# Patient Record
Sex: Female | Born: 1960 | Race: White | Hispanic: No | Marital: Married | Smoking: Current every day smoker
Health system: Southern US, Community
[De-identification: ages and names within clinical notes are randomized; demographics above are authoritative.]

## PROBLEM LIST (undated history)

## (undated) ENCOUNTER — Ambulatory Visit

## (undated) ENCOUNTER — Inpatient Hospital Stay

## (undated) ENCOUNTER — Encounter

## (undated) ENCOUNTER — Encounter
Attending: Student in an Organized Health Care Education/Training Program | Primary: Student in an Organized Health Care Education/Training Program

## (undated) ENCOUNTER — Ambulatory Visit: Payer: Medicare (Managed Care)

## (undated) ENCOUNTER — Ambulatory Visit
Payer: BLUE CROSS/BLUE SHIELD | Attending: Student in an Organized Health Care Education/Training Program | Primary: Student in an Organized Health Care Education/Training Program

## (undated) ENCOUNTER — Telehealth

## (undated) ENCOUNTER — Encounter
Attending: Preventive Medicine/Occupational Environmental Medicine | Primary: Preventive Medicine/Occupational Environmental Medicine

## (undated) ENCOUNTER — Ambulatory Visit: Payer: MEDICARE

## (undated) ENCOUNTER — Ambulatory Visit: Payer: Medicare (Managed Care) | Attending: Medical Oncology | Primary: Medical Oncology

## (undated) ENCOUNTER — Other Ambulatory Visit

## (undated) ENCOUNTER — Ambulatory Visit: Payer: BLUE CROSS/BLUE SHIELD

## (undated) ENCOUNTER — Telehealth: Attending: Medical Oncology | Primary: Medical Oncology

## (undated) DIAGNOSIS — I10 Essential (primary) hypertension: Secondary | ICD-10-CM

## (undated) DIAGNOSIS — E785 Hyperlipidemia, unspecified: Secondary | ICD-10-CM

## (undated) DIAGNOSIS — E1142 Type 2 diabetes mellitus with diabetic polyneuropathy: Secondary | ICD-10-CM

## (undated) DIAGNOSIS — E119 Type 2 diabetes mellitus without complications: Secondary | ICD-10-CM

## (undated) DIAGNOSIS — L98499 Non-pressure chronic ulcer of skin of other sites with unspecified severity: Secondary | ICD-10-CM

## (undated) DIAGNOSIS — I739 Peripheral vascular disease, unspecified: Secondary | ICD-10-CM

## (undated) HISTORY — DX: Hyperlipidemia, unspecified: E78.5

## (undated) HISTORY — PX: TONSILLECTOMY: SUR1361

## (undated) HISTORY — PX: TUBAL LIGATION: SHX77

## (undated) HISTORY — DX: Type 2 diabetes mellitus without complications: E11.9

## (undated) MED ORDER — SENNOSIDES 8.6 MG-DOCUSATE SODIUM 50 MG TABLET: Freq: Every day | ORAL | 0 days

## (undated) MED ORDER — PREGABALIN 200 MG CAPSULE: Freq: Three times a day (TID) | ORAL | 0.00000 days

## (undated) MED ORDER — TRAMADOL 50 MG TABLET: Freq: Four times a day (QID) | ORAL | 0 days | Status: SS

## (undated) MED ORDER — GABAPENTIN 100 MG CAPSULE: Freq: Three times a day (TID) | ORAL | 0 days | Status: SS

## (undated) MED ORDER — ZINC GLUCONATE 50 MG TABLET: Freq: Every day | ORAL | 0 days

## (undated) MED ORDER — ATORVASTATIN 20 MG TABLET: Freq: Every day | ORAL | 0 days

## (undated) MED ORDER — METFORMIN ER 1,000 MG 24 HR TABLET,EXTENDED RELEASE: Freq: Every day | ORAL | 0 days

## (undated) MED ORDER — ATORVASTATIN 80 MG TABLET: Freq: Every day | ORAL | 0 days

## (undated) MED ORDER — SULFAMETHOXAZOLE 800 MG-TRIMETHOPRIM 160 MG TABLET
Freq: Two times a day (BID) | ORAL | 0 days
Start: ? — End: 2020-07-18

## (undated) MED ORDER — POTASSIUM CHLORIDE ER 10 MEQ TABLET,EXTENDED RELEASE: Freq: Every day | ORAL | 0.00000 days

## (undated) MED ORDER — ASPIRIN 325 MG TABLET: Freq: Every day | ORAL | 0 days | Status: SS

## (undated) MED ORDER — OXYCODONE-ACETAMINOPHEN 10 MG-325 MG TABLET: Freq: Four times a day (QID) | ORAL | 0.00000 days | Status: SS | PRN

## (undated) MED ORDER — LISINOPRIL 10 MG TABLET: Freq: Every day | ORAL | 0.00000 days

## (undated) MED ORDER — ALBUTEROL SULFATE HFA 90 MCG/ACTUATION AEROSOL INHALER: 0 days

## (undated) MED ORDER — FERROUS SULFATE 325 MG (65 MG IRON) TABLET: Freq: Every day | ORAL | 0 days

## (undated) MED ORDER — METFORMIN 500 MG TABLET: ORAL | 0 days

## (undated) MED ORDER — ASPIRIN 81 MG TABLET,DELAYED RELEASE: Freq: Every day | ORAL | 0.00000 days

---

## 2004-09-16 HISTORY — PX: ILIAC ARTERY STENT: SHX1786

## 2005-06-26 ENCOUNTER — Inpatient Hospital Stay (HOSPITAL_COMMUNITY): Admission: RE | Admit: 2005-06-26 | Discharge: 2005-06-29 | Payer: Self-pay | Admitting: Vascular Surgery

## 2005-06-26 ENCOUNTER — Encounter (INDEPENDENT_AMBULATORY_CARE_PROVIDER_SITE_OTHER): Payer: Self-pay | Admitting: Specialist

## 2007-01-05 ENCOUNTER — Ambulatory Visit: Payer: Self-pay | Admitting: Cardiology

## 2007-01-08 ENCOUNTER — Inpatient Hospital Stay (HOSPITAL_BASED_OUTPATIENT_CLINIC_OR_DEPARTMENT_OTHER): Admission: RE | Admit: 2007-01-08 | Discharge: 2007-01-08 | Payer: Self-pay | Admitting: Cardiology

## 2007-01-08 ENCOUNTER — Ambulatory Visit: Payer: Self-pay | Admitting: Cardiology

## 2007-02-19 ENCOUNTER — Ambulatory Visit: Payer: Self-pay | Admitting: Cardiology

## 2008-12-26 ENCOUNTER — Ambulatory Visit (HOSPITAL_COMMUNITY): Admission: RE | Admit: 2008-12-26 | Discharge: 2008-12-26 | Payer: Self-pay | Admitting: Neurosurgery

## 2008-12-30 ENCOUNTER — Ambulatory Visit (HOSPITAL_COMMUNITY): Admission: RE | Admit: 2008-12-30 | Discharge: 2008-12-31 | Payer: Self-pay | Admitting: Neurosurgery

## 2010-02-16 IMAGING — CR DG CERVICAL SPINE 2 OR 3 VIEWS
1 series · 1 of 1 positions shown · non-contrast
Comparison: None

CLINICAL DATA: Cervical disc herniation and radiculopathy.

CERVICAL SPINE - 2-3 VIEW

[view not recorded]
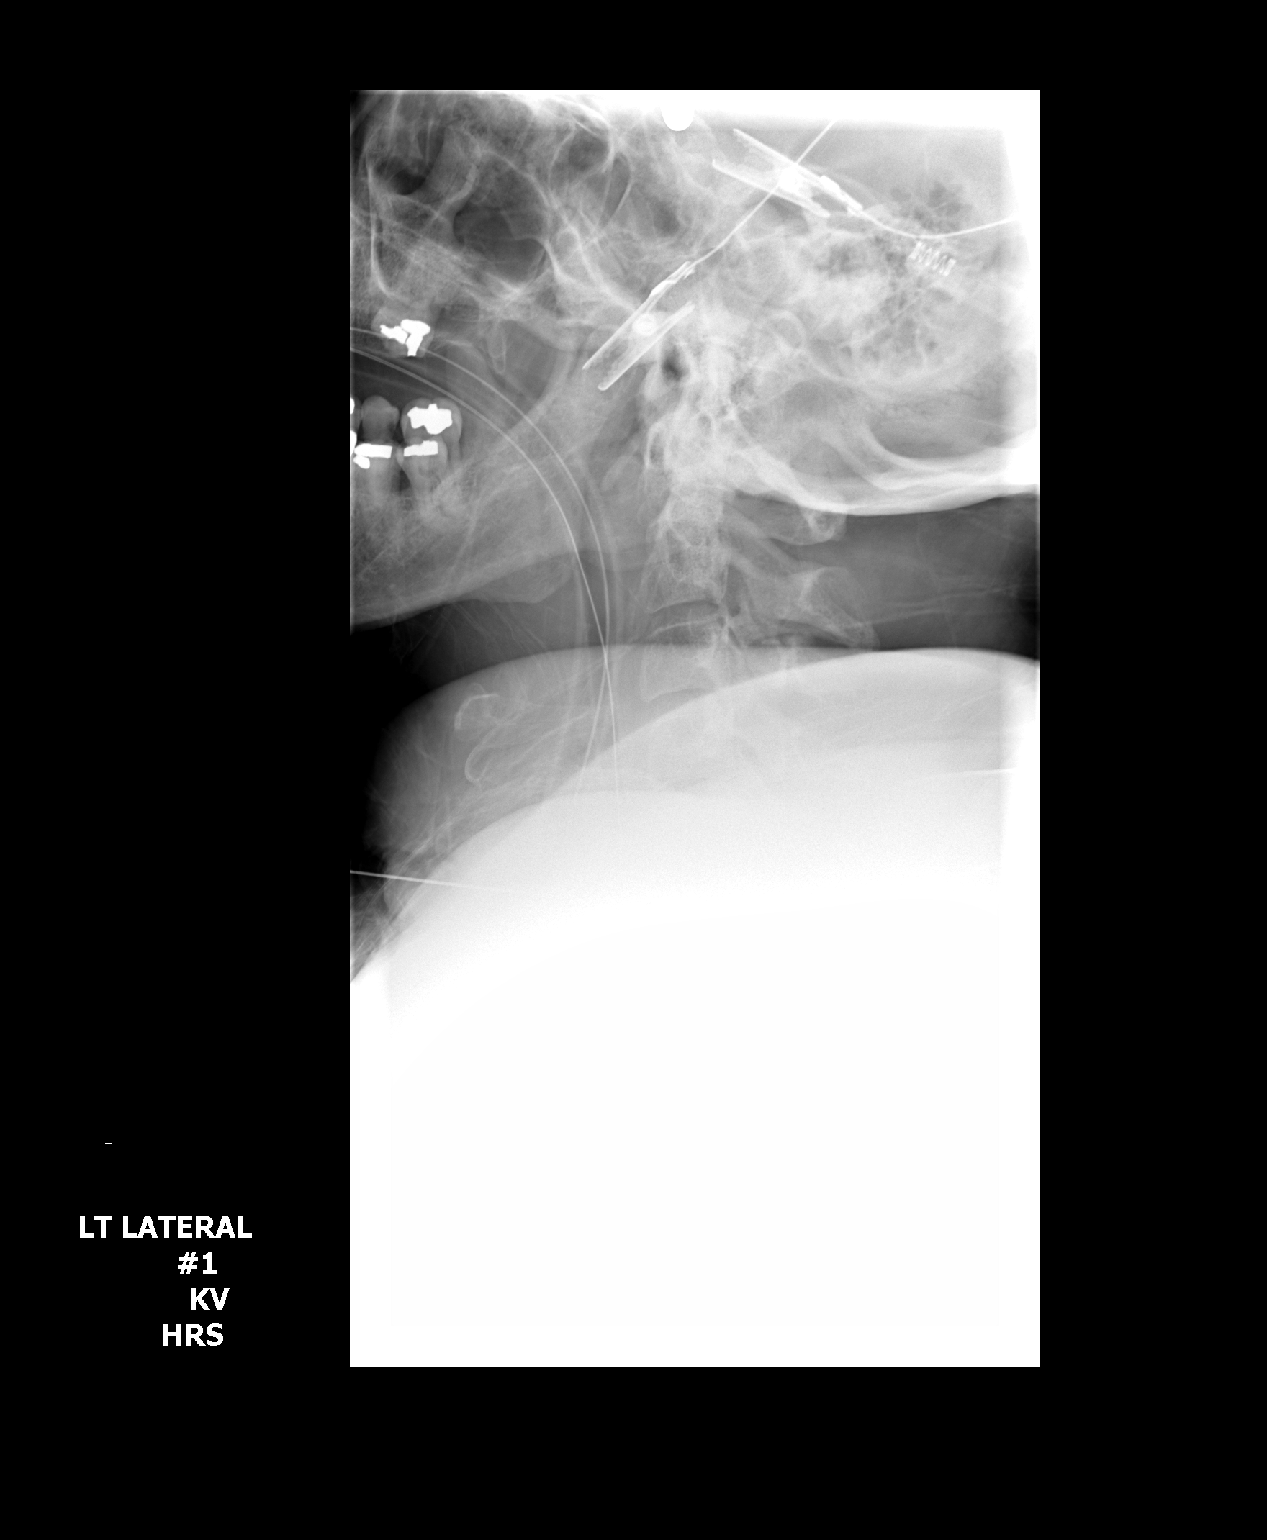

[1 of 1 positions shown; findings below may reference images not displayed]

FINDINGS: Intraoperative cross-table lateral radiograph 1 shows a
needle anteriorly directed at the level of C6 although this is
difficult to see with certainty due to positioning of the patient's
shoulders.

Intraoperative cross-table lateral view of the #2 shows needles
overlying the intervertebral disc spaces at C3-4 and C4-5.

Intraoperative cross-table lateral radiograph #3 shows interbody
fusion plugs and anterior fixation plate and screws at levels of C4-
5 and C5-6.  Alignment appears anatomic, however visualization of
the lower cervical spine is limited by positioning of the patient's
shoulders.
IMPRESSION: Anterior cervical spine fusion from C4-C6.  Alignment appears
anatomic, although visualization limited by positioning of
patient's shoulders.

## 2010-09-19 ENCOUNTER — Ambulatory Visit (HOSPITAL_COMMUNITY)
Admission: RE | Admit: 2010-09-19 | Discharge: 2010-09-20 | Payer: Self-pay | Source: Home / Self Care | Attending: Neurosurgery | Admitting: Neurosurgery

## 2010-09-19 LAB — GLUCOSE, CAPILLARY
Glucose-Capillary: 158 mg/dL — ABNORMAL HIGH (ref 70–99)
Glucose-Capillary: 177 mg/dL — ABNORMAL HIGH (ref 70–99)
Glucose-Capillary: 203 mg/dL — ABNORMAL HIGH (ref 70–99)

## 2010-09-20 LAB — GLUCOSE, CAPILLARY
Glucose-Capillary: 154 mg/dL — ABNORMAL HIGH (ref 70–99)
Glucose-Capillary: 216 mg/dL — ABNORMAL HIGH (ref 70–99)

## 2010-11-26 LAB — BASIC METABOLIC PANEL
BUN: 8 mg/dL (ref 6–23)
CO2: 27 mEq/L (ref 19–32)
Calcium: 9.6 mg/dL (ref 8.4–10.5)
Chloride: 97 mEq/L (ref 96–112)
Creatinine, Ser: 0.73 mg/dL (ref 0.4–1.2)
GFR calc Af Amer: >60 mL/min (ref >60)
GFR calc non Af Amer: >60 mL/min (ref >60)
Glucose, Bld: 185 mg/dL — ABNORMAL HIGH (ref 70–99)
Potassium: 4.6 mEq/L (ref 3.5–5.1)
Sodium: 134 mEq/L — ABNORMAL LOW (ref 135–145)

## 2010-11-26 LAB — TYPE AND SCREEN
ABO/RH(D): AB POS
Antibody Screen: NEGATIVE

## 2010-11-26 LAB — CBC
HCT: 44.9 % (ref 36.0–46.0)
Hemoglobin: 14.9 g/dL (ref 12.0–15.0)
MCH: 30.6 pg (ref 26.0–34.0)
MCHC: 33.2 g/dL (ref 30.0–36.0)
MCV: 92.2 fL (ref 78.0–100.0)
Platelets: 275 10*3/uL (ref 150–400)
RBC: 4.87 MIL/uL (ref 3.87–5.11)
RDW: 13.6 % (ref 11.5–15.5)
WBC: 9.8 10*3/uL (ref 4.0–10.5)

## 2010-11-26 LAB — ABO/RH: ABO/RH(D): AB POS

## 2010-11-26 LAB — SURGICAL PCR SCREEN
MRSA, PCR: NEGATIVE
Staphylococcus aureus: POSITIVE — AB

## 2010-12-26 LAB — GLUCOSE, CAPILLARY
Glucose-Capillary: 159 mg/dL — ABNORMAL HIGH (ref 70–99)
Glucose-Capillary: 174 mg/dL — ABNORMAL HIGH (ref 70–99)
Glucose-Capillary: 192 mg/dL — ABNORMAL HIGH (ref 70–99)
Glucose-Capillary: 128 mg/dL — ABNORMAL HIGH (ref 70–99)
Glucose-Capillary: 168 mg/dL — ABNORMAL HIGH (ref 70–99)

## 2010-12-26 LAB — URINALYSIS, ROUTINE W REFLEX MICROSCOPIC
Glucose, UA: NEGATIVE mg/dL
Ketones, ur: NEGATIVE mg/dL
Protein, ur: NEGATIVE mg/dL
Urobilinogen, UA: 0.2 mg/dL (ref 0.0–1.0)

## 2010-12-26 LAB — CBC
HCT: 44.8 % (ref 36.0–46.0)
Hemoglobin: 15.3 g/dL — ABNORMAL HIGH (ref 12.0–15.0)
MCHC: 34.2 g/dL (ref 30.0–36.0)
MCV: 92.8 fL (ref 78.0–100.0)
Platelets: 306 10*3/uL (ref 150–400)
RBC: 4.82 MIL/uL (ref 3.87–5.11)
RDW: 13.4 % (ref 11.5–15.5)
WBC: 10.2 10*3/uL (ref 4.0–10.5)

## 2010-12-26 LAB — BASIC METABOLIC PANEL
BUN: 9 mg/dL (ref 6–23)
CO2: 29 mEq/L (ref 19–32)
Calcium: 10.1 mg/dL (ref 8.4–10.5)
Chloride: 100 mEq/L (ref 96–112)
Creatinine, Ser: 0.72 mg/dL (ref 0.4–1.2)
GFR calc Af Amer: >60 mL/min (ref >60)
GFR calc non Af Amer: >60 mL/min (ref >60)
Glucose, Bld: 129 mg/dL — ABNORMAL HIGH (ref 70–99)
Potassium: 4.4 mEq/L (ref 3.5–5.1)
Sodium: 135 mEq/L (ref 135–145)

## 2010-12-26 LAB — URINE MICROSCOPIC-ADD ON

## 2011-01-29 NOTE — Assessment & Plan Note (Signed)
Kaitlyn Dougherty                          Kaitlyn Dougherty   Kaitlyn Dougherty, Kaitlyn Dougherty                       MRN:          [id number]  DATE:02/19/2007                            DOB:          Jan 16, 1961    PRIMARY CARE PHYSICIAN:  Dr. Selinda Flavin.   CARDIOLOGIST:  Dr. Lewayne Bunting.   HISTORY OF PRESENT ILLNESS:  Kaitlyn Dougherty is a 50 year old female patient  who has a history of peripheral arterial disease who recently presented  to our office with complaints of postprandial chest discomfort.  Given  her multiple cardiac risk factors, she was set up for cardiac  catheterization.  This was done by Dr. Antoine Poche on April 24th.  She had  minimal coronary plaquing by her cardiac catheterization.  Notably, she  had a long proximal 25% stenosis in the LAD, luminal irregularities in  the RCA and luminal irregularities in the PDA.  Her EF was 65%.  She  returns today for followup.  She notes she is doing well.  She has made  some lifestyle modifications and has been dieting.  She has lost 12  pounds.  She also notes that she has changed the way she has been  eating.  She notes that her chest discomfort has pretty much resolved.  She does Dougherty some palpitations.  However, this is more consistent with  elevated heart rates with exertion.  She denies any palpitations at  rest.  Denied any syncope or near syncope.  Denies any significant  shortness of breath.   CURRENT MEDICATIONS:  1. Aspirin 325 mg daily.  2. Simvastatin 20 mg daily.  3. Lisinopril 20 mg a day.  4. Metformin 500 mg, one in the morning, two in the evening.  5. Nitroglycerin p.r.n.  6. Xanax p.r.n.   ALLERGIES:  NO KNOWN DRUG ALLERGIES.   SOCIAL HISTORY:  She continues to smoke cigarettes.   PHYSICAL EXAM:  She is a well-nourished, well-developed female in no  acute distress.  Blood pressure 117/76, pulse 77, weight 258.2 pounds.  HEENT:  Normal.  NECK:  Without JVD.  Positive bilateral  carotid bruits.  CARDIAC:  Normal S1, S2, regular rate and rhythm without murmurs.  LUNGS:  Clear to auscultation bilaterally without wheezing, rhonchi or  rales.  ABDOMEN:  Soft, nontender, with normoactive bowel sounds, no  organomegaly.  EXTREMITIES:  Without edema.  Calves soft, nontender.  Skin is warm and  dry.  Right femoral arterial laminotomy site without hematoma or bruit.   IMPRESSION:  1. Minimal nonobstructive coronary artery disease by recent cardiac      catheterization.  2. Preserved left ventricular function.  3. Noncardiac chest pain.      a.     Quiescent.  4. Hypertension.  5. Hyperlipidemia.  6. Peripheral arterial disease.      a.     Status post bilateral common iliac artery stenting and       popliteal artery embolectomy, October, 2006.  7. Bilateral carotid artery bruits.  8. Treated dyslipidemia.  9. Obesity.  10.Tobacco abuse.   PLAN:  The patient presents to  the office today for followup.  She is  doing well from a cardiovascular standpoint.  Aggressive risk factor  modification is recommended at this point in time.  She is on a fairly  good medical regimen.  She is doing well with diet and exercise.  I  talked to her briefly about tobacco cessation.  We talked about cutting  back and setting a quit date.  She has quit in the past and thinks she  will be successful.  She does have bilateral carotid artery bruits.  She  is at risk for carotid artery stenosis.  Will set her up for carotid  Dopplers.  She will be set up for annual followup.      Kaitlyn Newcomer, PA-C  Electronically Signed      Learta Codding, MD,FACC  Electronically Signed   SW/MedQ  DD: 02/19/2007  DT: 02/19/2007  Job #: [id number]   cc:   Selinda Flavin

## 2011-01-29 NOTE — Op Note (Signed)
NAME:  REMINGTYN, DEPAOLA NO.:  [id number]   MEDICAL RECORD NO.:  [id number]          PATIENT TYPE:  OIB   LOCATION:  3524                         FACILITY:  [facility]   PHYSICIAN:  Coletta Memos, M.D.     DATE OF BIRTH:  1961/08/29   DATE OF PROCEDURE:  12/30/2008  DATE OF DISCHARGE:  12/31/2008                               OPERATIVE REPORT   PREOPERATIVE DIAGNOSES:  Displaced disk C4-5, C5-6 spondylosis, C5  radiculopathy, left side.   COMPLICATIONS:  None.   SURGEON:  Coletta Memos, M.D.   PROCEDURE:  1. Anterior cervical decompression C4-5, C5-6.  2. Arthrodesis with 6-mm allograft C4-5 and C5-6.  3. Anterior instrumentation 30-mm Vectra plate.   ASSISTANT:  Clydene Fake, M.D..   ANESTHESIA:  General endotracheal.   INDICATIONS:  Mrs. Michelsen presented with a 0/5 deltoid on the left side.  MRI showed a large disk herniation at C4-5.  It also was unclear whether  or not there was a disk at C5-6 or just spondylitic change.  She  underwent a myelogram and it showed both.  I therefore recommended and  she agreed to undergo operative decompression at C4-5 on C5-6.   OPERATIVE NOTE:  Ms. Ates was brought to the operating room, intubated  and placed under a general anesthetic without difficulty.  Head was  positioned in extension on a horseshoe headrest of 5 pounds of traction  applied via chin strap.  I infiltrated 3 mL 0.5% lidocaine, 1:200,000  strength epinephrine into the upper fold in her neck.  I opened the skin  with a #10 blade and took this down to the platysma.  I then dissected  using the Metzenbaum scissors and opened the platysma in a horizontal  fashion.  I then created an avascular corridor to the cervical spine.  I  placed spinal needle to localize.  I was on the correct location.  I  then reflected the longus colli muscles bilaterally.  I placed a self-  retaining retractor.  I opened the disk spaces at both C5-6 and at C4-5.  I then placed  distraction pins one at C5, one at C4, distracted the disk  space and then started with my decompression.   I decompressed the C4-5 level using a high-speed drill, Kerrison  punches, and curettes.  There was a significant amount of redundant  posterior longitudinal ligament which I was able to get underneath with  the Kerrison punch and opened that, and then I encountered the removal  of large chunks of herniated disk overlying the nerve root.  When I was  finished decompressing the C5 root on the left side, it was pulsatile.  I was not able to express any more disk material and I felt that a full  decompression was obtained.  I decompressed the right side, though  not  to the same degree as she had no problems on the right side.  I then  prepared for arthrodesis.  I used a drill to prepare the endplates to  accept the graft.  I sized the space and felt the 6-mm  structural  allograft would be best.  I placed a graft and then turned my attention  to C5-6.  I removed the distraction pin from C4, placed it at C6.  I  moved the self-retaining retractors caudally.  I then proceeded to  complete the diskectomy at C5-6.   I completed diskectomy and in the same process, decompressed the spinal  canal and C6 nerve roots bilaterally at C5-6 using Kerrison punches, a  high-speed drill, and curettes.  I did this until I had very good  decompression of both the nerve roots and spinal canal.  I then prepared  for arthrodesis.   I used a drill to prepare the endplates and even them out to accept the  graft.  I placed another 6-mm structural allograft.  The arthrodesis now  complete, I then placed the instrumentation.   With Dr. Doreen Beam help, we placed a 30-mm plate, placing 2 screws at C4,  1 in C5, 2 in C6.  One screw placed at C5 because the other screw did  not lock to the plate and I had good purchase at all other sites.  So  there was no reason to place it.  Each screw hole was first drilled  then  used self-tapping screws.  We took an x-ray, showed the plate, plug and  screws to be in good position.  I then irrigated the wound.  I then  closed the wound in layered fashion using Vicryl sutures with Dr.  Doreen Beam assistance.  I used Dermabond for sterile dressing.  I  reapproximated the platysma in subcuticular layers.  She was extubated,  moving all extremities.           ______________________________  Coletta Memos, M.D.     KC/MEDQ  D:  12/30/2008  T:  12/31/2008  Job:  [id number]

## 2011-02-01 NOTE — Cardiovascular Report (Signed)
NAME:  Kaitlyn Dougherty, DRENNEN NO.:  [id number]   MEDICAL RECORD NO.:  [id number]          PATIENT TYPE:  OIB   LOCATION:  1965                         FACILITY:  [facility]   PHYSICIAN:  Rollene Rotunda, MD, FACCDATE OF BIRTH:  08/11/1961   DATE OF PROCEDURE:  01/08/2007  DATE OF DISCHARGE:                            CARDIAC CATHETERIZATION   PRIMARY CARE PHYSICIAN:  Selinda Flavin, M.D.   REASON FOR PRESENTATION:  Evaluate patient with chest pain suggestive of  unstable angina.  Multiple cardiovascular risk factors.   PROCEDURE:  Left heart catheterization performed of the right femoral  artery.  The artery was cannulated using arterial wall  puncture.  A #4-  [place] arterial sheath was inserted via the modified Seldinger  technique.  A preformed Judkins and pigtail catheter were utilized.  The  patient tolerated the procedure well and left the lab in stable  condition.   RESULTS:  Hemodynamics - LV 130/34, AO 131/92.  Coronaries - left main  was normal.  The LAD had a long proximal 25% stenosis.  The circumflex  was essentially a large obtuse marginal.  It was branching and normal.  The right coronary artery is a dominant vessel.  There were diffuse  luminal irregularities.  The PDA was moderate size with luminal  irregularities.  Posterolateral was very small and normal.   LEFT VENTRICULOGRAM:  The left ventriculogram was obtained in the RAO  projection.  The EF was 65% with normal wall motion.   CONCLUSION:  Mild coronary plaque.  Normal left ventricular function.   PLAN:  No further cardiac workup is suggested.  She will continue to  have aggressive secondary risk reduction (the patient does have  peripheral vascular disease).  She will be referred back Dr. Dimas Aguas for  evaluation of nonanginal chest pain.      Rollene Rotunda, MD, [facility]  Electronically Signed     JH/MEDQ  D:  01/08/2007  T:  01/08/2007  Job:  [id number]   cc:   Selinda Flavin  [facility]  [place]

## 2011-02-01 NOTE — Op Note (Signed)
Kaitlyn Dougherty, FRUGE NO.:  [id number]   MEDICAL RECORD NO.:  [id number]          PATIENT TYPE:  INP   LOCATION:  2399                         FACILITY:  [facility]   PHYSICIAN:  Quita Skye. Hart Rochester, M.D.  DATE OF BIRTH:  July 04, 1961   DATE OF PROCEDURE:  06/26/2005  DATE OF DISCHARGE:                                 OPERATIVE REPORT   PREOPERATIVE DIAGNOSIS:  Ischemic left leg secondary to distal emboli from  severe left iliac stenosis to distal popliteal artery, tibioperoneal trunk  and origin of tibial vessels.   POSTOPERATIVE DIAGNOSIS:  Ischemic left leg secondary to distal emboli from  severe left iliac stenosis to distal popliteal artery, tibioperoneal trunk  and origin of tibial vessels.   OPERATION:  Exploration of left distal popliteal artery with transpopliteal  embolectomy of anterior tibial, posterior tibial, and peroneal arteries,  with intraoperative arteriogram.   SURGEON:  Quita Skye. Hart Rochester, M.D.   FIRST ASSISTANT:  Stephanie Acre Dominick, PA.   ANESTHESIA:  General endotracheal.   BRIEF HISTORY:  This patient presented with symptoms of left leg  claudication dating back one year and acute onset of left foot discomfort  and numbness three days ago.  Angiograms today revealed near-total occlusion  of her left common iliac artery at the origin with some disease at the  origin of the right common iliac artery and distal embolization to the left  distal popliteal and tibial vessels.  She underwent PTA and stenting of both  iliac arteries by Dr. Arbie Cookey and was then taken to the operating room for  exploration of her left popliteal artery for embolectomy procedure.   The patient was taken to the operating room, placed in the supine position,  at which time satisfactory general endotracheal anesthesia was administered.  The left leg was prepped with Betadine scrub and solution, draped in the  routine sterile manner.  Medial incision was made below the knee.   The  popliteal artery was exposed, being careful not to injure the saphenous  vein, which was not exposed.  The popliteal artery was exposed down distally  to the origin of the anterior tibial, posterior tibial and peroneal  arteries, all of which were encircled with vessel loops after dividing the  anterior tibial vein.  There was obvious thrombus within the distal popliteal artery and a pulse  down to that point.  Heparin 6000 units was given intravenously, the  arteries occluded with vessel loops and a transverse opening made in the  distal popliteal artery with 15 blade and extended with the Potts scissors.  There was thrombus filling the vessel.  A #3 Fogarty catheter was then  passed proximally up to the femoral level to the common femoral level and  upon return, a short core of proximal thrombus was removed, followed by  excellent inflow.  The Fogarty was then passed down the anterior tibial  artery to the ankle, which was the best vessel, and upon return a core of  thrombus from the origin of the vessel was retrieved but no distal clot.  There was good backbleeding.  The tibioperoneal trunk was totally occluded,  and the Fogarty was then passed down the peroneal and posterior tibial  arteries through the same arteriotomy with the worst vessel being a  posterior tibial artery, which was small but was patent.  Following  completion of this and heparin-saline irrigation, the arteriotomy was closed  with two 6-0 Prolene sutures, the clamp released, and there was an excellent  pulse and Doppler flow in all the vessels.  Intraoperative arteriogram was performed, which revealed a widely patent  distal popliteal artery with runoff through  all three vessels, the best being the anterior tibial and peroneal.  Adequate hemostasis was achieved, and the wound was closed with Vicryl in a  subcuticular fashion.  Sterile dressing applied, and the patient taken to  the recovery in satisfactory  condition.           ______________________________  Quita Skye Hart Rochester, M.D.     JDL/MEDQ  D:  06/26/2005  T:  06/26/2005  Job:  [id number]

## 2011-02-01 NOTE — Assessment & Plan Note (Signed)
[facility]                          [facility]ICE NOTE   RUBERTA, HOLCK                       MRN:          [id number]  DATE:01/05/2007                            DOB:          05-15-1961    PRIMARY CARE PHYSICIAN:  Dr. Selinda Flavin.   REASON FOR CONSULTATION:  Ms. Arras is a 50 year old female with no  documented history of coronary artery disease but numerous cardiac risk  factors as well as history of peripheral vascular disease.  In October  2006, the patient presented with history of left lower extremity  claudication and development of acute onset of left foot discomfort and  numbness.  She underwent peripheral angiography by Dr. Josephina Gip  which revealed near total occlusion of the left common iliac artery as  well as distal embolization to the left distal popliteal and tibial  vessels.  She also had some disease of the right common iliac artery and  underwent successful PTA/stenting of both iliac arteries by Dr. Gretta Began.  She then underwent successful embolectomy of the left popliteal  artery by Dr. Josephina Gip.   The patient is now referred to Dr. Lewayne Bunting for evaluation of recent  development of chest discomfort.  Her cardiac risk factors are notable  for newly diagnosed diabetes mellitus and hypertension with prior  history of hyperlipidemia and longstanding history of tobacco smoking.   Although patient denies any exertional chest discomfort, she reports  development of new midsternal chest pressure which is precipitated  only by eating.  There is no radiation to the jaw or upper extremities,  and the symptoms typically resolve after 30-60 minutes.  She typically  takes a Rolaids for this and feels that it helps somewhat.  These same  symptoms, however, are not precipitated by any strenuous activity such  as walking up a hill or a flight of stairs.  She does have exertional  dyspnea associated with these  activities, however.   With respect to prior workup, patient reportedly had a negative stress  echocardiogram in 2003.  She also has had recent lower extremity  arterial and venous Dopplers; both reportedly within normal limits.   A 12-lead electrocardiogram today reveals NSR at 76 BPM with normal  access and nonspecific ST abnormalities with small T wave inversion  noted in leads V3 and V4.   ALLERGIES:  No known drug allergies.   CURRENT MEDICATIONS:  1. Full dose aspirin.  2. Metformin 500 b.i.d.  3. Simvastatin 20 daily.  4. Lisinopril 20 daily.   PAST MEDICAL HISTORY:  1. Peripheral vascular disease.      a.     Status post bilateral PTA/stenting common iliac artery by       Dr. Gretta Began October 2006.  2. Hypertension.  3. Type 2 diabetes mellitus.  4. Hyperlipidemia.  5. Morbid obesity.  6. Bilateral tubal ligation.   SOCIAL HISTORY:  The patient is married.  Has a total of six children.  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.  She has been smoking off and on at  least 1-2 packs a day since the age  of 60.  She reportedly quit after undergoing peripheral vascular  stenting in October 2006, but just resumed last week.  She denies  alcohol or illicit drug use.   FAMILY HISTORY:  Negative for premature coronary artery disease.   REVIEW OF SYSTEMS:  The patient can climb a flight of stairs with some  associated dyspnea but no associated chest discomfort.  She has not had  any recurrent left lower extremity claudication since undergoing  percutaneous stenting.  Otherwise, as per HPI.  Remaining systems  negative.   PHYSICAL EXAMINATION:  VITAL SIGNS:  Blood pressure 136/87, pulse 83  regular, weight 268.  GENERAL:  A 50 year old female morbidly obese sitting upright in no  distress.  HEENT:  Normocephalic, atraumatic.  NECK:  Palpable bilateral carotid pulses with high-pitched right carotid  bruit and left supraclavicular bruit.   LUNGS:  Clear to auscultation all fields.  HEART:  Regular rate and rhythm (S1, S2).  No significant murmurs.  ABDOMEN:  Protuberant, nontender.  EXTREMITIES:  Palpable bilateral femoral pulses without bruits;  minimally palpable peripheral pulses with trace pedal edema.  NEUROLOGICAL:  No focal deficits.   IMPRESSION:  1. Postprandial angina pectoris.      a.     Worrisome for ischemic etiology.  2. Multiple cardiac risk factors.      a.     Hypertension.      b.     Hyperlipidemia.      c.     Type 2 diabetes mellitus.      d.     Longstanding tobacco.      e.     Obesity.  3. Peripheral vascular disease.      a.     Status post bilateral common iliac artery stenting, October       2006.  4. Right carotid bruit.   PLAN:  1. The patient's symptoms are worrisome for significant underlying      coronary artery disease particularly in the context of numerous      cardiac risk factors and documented history of peripheral vascular      disease.  Therefore, rather than repeating a stress test,      recommendation is to proceed with an elective diagnostic cardiac      catheterization in the [facility] lab, and patient is agreeable with this      plan.  The risks/benefits have been discussed and reviewed with Dr.      Lewayne Bunting who is in agreement with this plan.  2. The patient's current medication regimen has been reviewed, and we      will add nitroglycerin p.r.n.  3. The patient will need subsequent evaluation to rule out significant      cerebrovascular disease given the finding of a right carotid bruit      on examination.  We will defer this to a later date pending the      findings of her cardiac catheterization.      Gene Serpe, PA-C  Electronically Signed      Learta Codding, MD,FACC  Electronically Signed   GS/MedQ  DD: 01/05/2007  DT: 01/06/2007  Job #: [id number]   cc:   Fara Chute

## 2011-02-01 NOTE — Discharge Summary (Signed)
Kaitlyn Dougherty, Kaitlyn Dougherty NO.:  [id number]   MEDICAL RECORD NO.:  [id number]          PATIENT TYPE:  INP   LOCATION:  2038                         FACILITY:  [facility]   PHYSICIAN:  Quita Skye. Hart Rochester, M.D.  DATE OF BIRTH:  01/11/1961   DATE OF ADMISSION:  06/26/2005  DATE OF DISCHARGE:  06/29/2005                                 DISCHARGE SUMMARY   ADMISSION DIAGNOSIS:  Severe claudication, left lower extremity.   DISCHARGE DIAGNOSES:  1.  Hyperlipidemia.  2.  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   ALLERGIES:  No known drug allergies.   HISTORY OF PRESENT ILLNESS:  The patient is a 50 year old, Caucasian obese  female with a history of left calf and thigh claudication for approximately  1 year.  This limited her walking to less than 1 block.  Forty-eight hours  prior to admission, the patient developed increasing discomfort in the left  foot as well as some numbness and aching in the distal calf.  This was  improved by hanging her foot in the dependent position.  She was able to  ambulate distances, but did have severe claudications symptoms in the left  leg with no right leg symptoms.  The patient had no history of nonhealing  ulcers or previous vascular problems.  She was evaluated by Dr. Josephina Gip  and it was his opinion that the patient had severe left iliac occlusive  disease with possible femoral, popliteal or tibial disease.  She was  admitted and scheduled for angiography to evaluate her anatomy.   HOSPITAL COURSE:  The patient was admitted on June 26, 2005, with severe  left foot ischemia.  She was then taken to the [facility] for an aortogram with  bilateral  lower extremity runoff and bilateral common iliac artery  angioplasty with kissing balloon technique.  The patient tolerated this  procedure well and was hemodynamically stable immediately postoperatively.  She was subsequently transferred to the OR secondary to ischemic left foot  due to distal emboli from severe left iliac stenosis.  At that time, she  underwent exploration of left distal popliteal artery with transpopliteal  embolectomy of the anterior tibial, posterior tibial and peroneal arteries  with intraoperative arteriogram.  The patient tolerated the procedure well  and was hemodynamically stable immediately postoperatively.  The patient was  transferred from the OR to the post anesthesia care unit in stable  condition.  The patient was extubated without complications neurologically  intact.   The patient's postoperative course has progressed as expected.  On postop  day #1, ABIs were performed and this revealed a greater than 1.0  bilaterally.  She was afebrile with stable vital signs and the left foot  felt better.  She had 3+ palpable pulses  in the DP and PT bilaterally.  The  left popliteal wound was healing well.  She had been continued on heparin  and this was subsequently stopped on postop day #2.  The patient was  ambulated starting on postop day #1 and has continued to increase her  tolerance to a satisfactory level at this time.   On postop day #2, the patient is again without complaints, afebrile with  stable vital signs.  She is ambulating.  The left lower extremity has 2-3+  palpable pedal pulses and the incision is clean, dry and intact with only  mild erythema surrounding the site secondary to adhesive from tape.  The  patient is in stable condition at this time and as long as she continues to  progress in the current manner, we will be ready for discharge in the next 1-  2 days pending morning round reevaluation.   LABORATORY DATA AND X-RAY FINDINGS:  CBC and  BMP on June 28, 2005, with  white count 9.2, hemoglobin 11.9, hematocrit 33.7, platelets 217.  Sodium  130, potassium 5.4, the sample was hemolyzed, BUN 12, creatinine 0.8,  glucose 184.   CONDITION ON DISCHARGE:  Improved.   DISCHARGE MEDICATIONS:  1.  Plavix 75 mg daily x30 days, then discontinuation with the start of      aspirin 325 mg p.o. daily.  2.  Tylox one to two q.4-6h. p.r.n. pain.   DIET:  Low salt, low fat diet.   ACTIVITY:  The patient should increase her activity slowly with daily walks.  No driving x2 weeks.  She may walk up steps.  The patient should also  continue daily breathing exercises.   WOUND CARE:  The patient should shower daily and clean the incisions with  soap and water.  If wound problems arise, she should contact CVTS office.   FOLLOW UP:  Follow-up appointment with Dr. Hart Rochester including ABIs to be  performed on July 23, 2005, at 2 p.m.      Pecola Leisure, PA    ______________________________  Quita Skye Hart Rochester, M.D.    AY/MEDQ  D:  06/28/2005  T:  06/29/2005  Job:  [id number]

## 2011-02-01 NOTE — H&P (Signed)
NAME:  RELDA, AGOSTO NO.:  [id number]   MEDICAL RECORD NO.:  [id number]          PATIENT TYPE:  INP   LOCATION:  NA                           FACILITY:  [facility]   PHYSICIAN:  Quita Skye. Hart Rochester, M.D.  DATE OF BIRTH:  [birth year]   DATE OF ADMISSION:  06/26/2005  DATE OF DISCHARGE:                                HISTORY & PHYSICAL   PRIMARY MEDICAL DOCTOR:  Dr. Selinda Flavin.   CHIEF COMPLAINT:  Severe claudication, left leg, with pain and numbness in  the left foot intermittently.   HISTORY OF PRESENT ILLNESS:  This 50 year old female patient has had a  history of left calf and thigh claudication for at least one year, limiting  her walking to less than one block.  Forty-eight hours ago, she developed  some increasing discomfort in the left foot as well as some mild numbness as  well as some aching in the distal calf.  This was improved or relieved by  hanging her foot in the dependent position.  She was still able to ambulate  similar distances but did have severe claudication symptoms in the left leg  with no right leg symptoms.  She has no history of nonhealing ulcers or  previous vascular problems.   PAST MEDICAL HISTORY:  Hyperlipidemia but negative for diabetes,  hypertension, coronary artery disease, congestive heart failure, deep venous  thrombosis or pulmonary emboli.  She did have a negative cardiac stress test  approximately one to one and a half years ago.   PAST SURGICAL HISTORY:  Includes tubal ligation and tonsillectomy.   FAMILY HISTORY:  Negative for coronary artery disease, diabetes or stroke.  Her father died of COPD.   SOCIAL HISTORY:  She is married and has three children.  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  She smokes one pack of cigarettes per  day and has done so for 26+ years.  Does not use alcohol.   REVIEW OF SYSTEMS:  Denies any loss of appetite, weight loss or anorexia.  Has no dyspnea on exertion, PND,  orthopnea or chest pain.  Also denies  hemoptysis, chronic cough or wheezing.  No GI or GU symptoms.  Neurologic  symptoms include some occasional dizziness and headaches.   MEDICATIONS:  None.   ALLERGIES:  None known.   PHYSICAL EXAMINATION:  GENERAL:  This lady is a middle-aged, obese female  who is in no apparent distress.  She is alert and oriented x 3.  NECK:  Supple.  Three-plus carotid pulse is palpable.  No bruits are  audible.  There is no palpable adenopathy in the neck.  NEUROLOGIC:  Unremarkable.  VITAL SIGNS:  Blood pressure 180/100, heart rate 88, respirations 18.  Upper  extremity pulses are 3+ bilaterally.  CHEST:  Clear to auscultation.  CARDIOVASCULAR:  Reveals a regular rhythm with no murmurs.  ABDOMEN:  Quite obese with no palpable masses.  EXTREMITIES:  Right leg has a 3+ femoral pulse palpable with 2+ popliteal  and 2+ dorsalis pedis pulse with no evidence of ischemia.  Left leg has  absent femoral and distal pulses.  Left foot is cooler than the right with  some mild bluish discoloration.  She does have intact motion with no calf  tenderness and some very mild decreased sensation.   Arterial Doppler studies revealed normal pressures in the right leg with 1.0  index.  The left leg has an index of 0.23 with inaudible flow in the  posterior tibial and peroneal arteries.   IMPRESSION:  1.  Severe left iliac occlusive disease, with possible femoral, popliteal or      tibial disease, with severe claudication and progressive ischemia, left      foot.  2.  Obesity.  3.  Hyperlipidemia.  4.  Tobacco abuse.   PLAN:  To perform angiography tomorrow, October 11th, and then proceed with  surgical repair, probable aortobifemoral bypass grafting, which was  discussed with the patient today as well as the risks and benefits of this  procedure.           ______________________________  Quita Skye. Hart Rochester, M.D.     JDL/MEDQ  D:  06/25/2005  T:  06/25/2005  Job:   [id number]

## 2011-02-01 NOTE — Op Note (Signed)
NAMELILEIGH, FAHRINGER NO.:  [id number]   MEDICAL RECORD NO.:  [id number]          PATIENT TYPE:  INP   LOCATION:  2399                         FACILITY:  [facility]   PHYSICIAN:  Larina Earthly, M.D.    DATE OF BIRTH:  09-27-1960   DATE OF PROCEDURE:  06/26/2005  DATE OF DISCHARGE:                                 OPERATIVE REPORT   PREOPERATIVE DIAGNOSIS:  Severe left foot ischemia.   POSTOPERATIVE DIAGNOSIS:  Severe left foot ischemia.   PROCEDURE:  1.  Aortogram with bilateral lower extremity runoff.  2.  Bilateral common iliac artery angioplasty with kissing balloon      technique.   SURGEON:  Larina Earthly, M.D.   ANESTHESIA:  Lidocaine local.   COMPLICATIONS:  None.   DISPOSITION:  To holding area, stable.   INDICATION FOR PROCEDURE:  The patient is a 50 year old white female  presenting with critical ischemia of her left foot. She had no left femoral  pulse and had normal right femoral pulse. She was taken to the peripheral  angio suite for arteriogram today with the plan for probable aortofemoral  bypass grafting later today by Dr. Jerilee Field.   PROCEDURE IN DETAIL:  The patient was taken to the peripheral cath lab and  placed in the supine position where the area of both groins was prepped and  draped in the usual sterile fashion. Using local anesthesia and a single  wall puncture, the right common femoral artery was entered and a guidewire  was passed up to the level of the suprarenal aorta. A 5-French sheath was  passed over the guidewire and a pigtail catheter was positioned at the level  of the suprarenal aorta. AP projection was undertaken. This showed a short  segment total occlusion of her common iliac artery at its origin. There was  also irregularity with no true flow-limiting stenosis in the right common  iliac artery just distal the aortic bifurcation. Runoff films were obtained  through the same catheter and this revealed normal  external iliac on the  right and left. The patient did have small arteries in general. Her left leg  runoff revealed widely patent normal superficial femoral artery and  popliteal artery. There was an abrupt occlusion just above the takeoff of  the anterior tibial artery on the left. There was reconstitution of the  anterior tibial, posterior tibial, and peroneal arteries with the anterior  tibial being the dominant runoff vessel. This did appear to be obvious  emboli. I reviewed the films with Dr. Jerilee Field and discussed this with  the patient. She was felt to be a candidate for attempted crossing of the  occlusion of the left common iliac artery occlusion and angioplasty of this.  This would prevent her from having to have aortofemoral bypass grafting and  then she could undergo exploration of her popliteal artery with embolectomy  as later in the day following the procedure. The patient understood and  agreed to this plan. The left groin femoral artery was isolated with Doppler  and using local anesthesia and  single wall puncture, the left common femoral  artery was entered and a guidewire was passed up to the level of the  occlusion. A long 6-French sheath was passed up just distal to this. The  right groin short 5-French sheath was exchanged for a long 6-French sheath.  The patient was given 6000 units of intravenous heparin. A Wholey wire  initially was attempted to pass the stenosis. This was not possible. A head  hunter catheter was then passed over the [facility] wire and using the head  hunter catheter and the [facility] wire, the occlusion was crossed. The long 6-  French sheath was then passed up into the distal aorta from the right and  left groin side. Repeat arteriogram through the sheath showed the  positioning of the aortic bifurcation. An 8 mm balloon was chosen on the  right and a 7 mm balloon was chosen initially on the left. A 24 mm stent was  positioned on the right and a  29 mm stent was positioned on the left. These  were positioned just inside the aorta at the bifurcation and were inflated  in a kissing balloon technique. Repeat angiogram showed excellent  positioning of these with widely patent iliacs. There was some mild  narrowing in the iliac artery on the left at the level of the prior  occlusion. An incision was made to up size this to an 8 mm balloon. The 8 mm  balloon was positioned over the guidewire at the level of the prior position  stent and also an 8 mm balloon was replaced in the right groin and again  these were inflated in the kissing technique. Next, right leg runoff was  obtained via the right femoral sheath. This revealed a widely patent  superficial femoral and popliteal artery and normal three-vessel runoff on  the right. Finally, with antegrade flow down the left leg, a final left  arteriogram was obtained again to show the flow in the tibial vessels on the  left. The patient tolerated the procedure without complication and was  transferred to the holding area where the sheaths were pulled after the ACT  had normalized. The patient then will undergo left popliteal exploration by  Dr. Hart Rochester which will be dictated as a separate note.   FINDINGS:  1.  Short segment occlusion of the left common iliac artery at its takeoff.  2.  Irregular mild stenosis in the right common iliac artery at its takeoff.  3.  Widely patent femoral and popliteal arteries bilaterally.  4.  Occlusion of the trifurcation with reconstitution of the anterior      tibial, posterior tibial, and peroneal arteries on the left.  5.  Normal three-vessel runoff on the right.      Larina Earthly, M.D.  Electronically Signed     TFE/MEDQ  D:  06/26/2005  T:  06/26/2005  Job:  [id number]

## 2011-11-06 IMAGING — CR DG CERVICAL SPINE 1V
1 series · 1 of 1 positions shown · non-contrast
Comparison: Prior studies 12/30/2008.

CLINICAL DATA: Cervical radiculopathy.  Intraoperative
localization.

CERVICAL SPINE - 1 VIEW

[view not recorded]
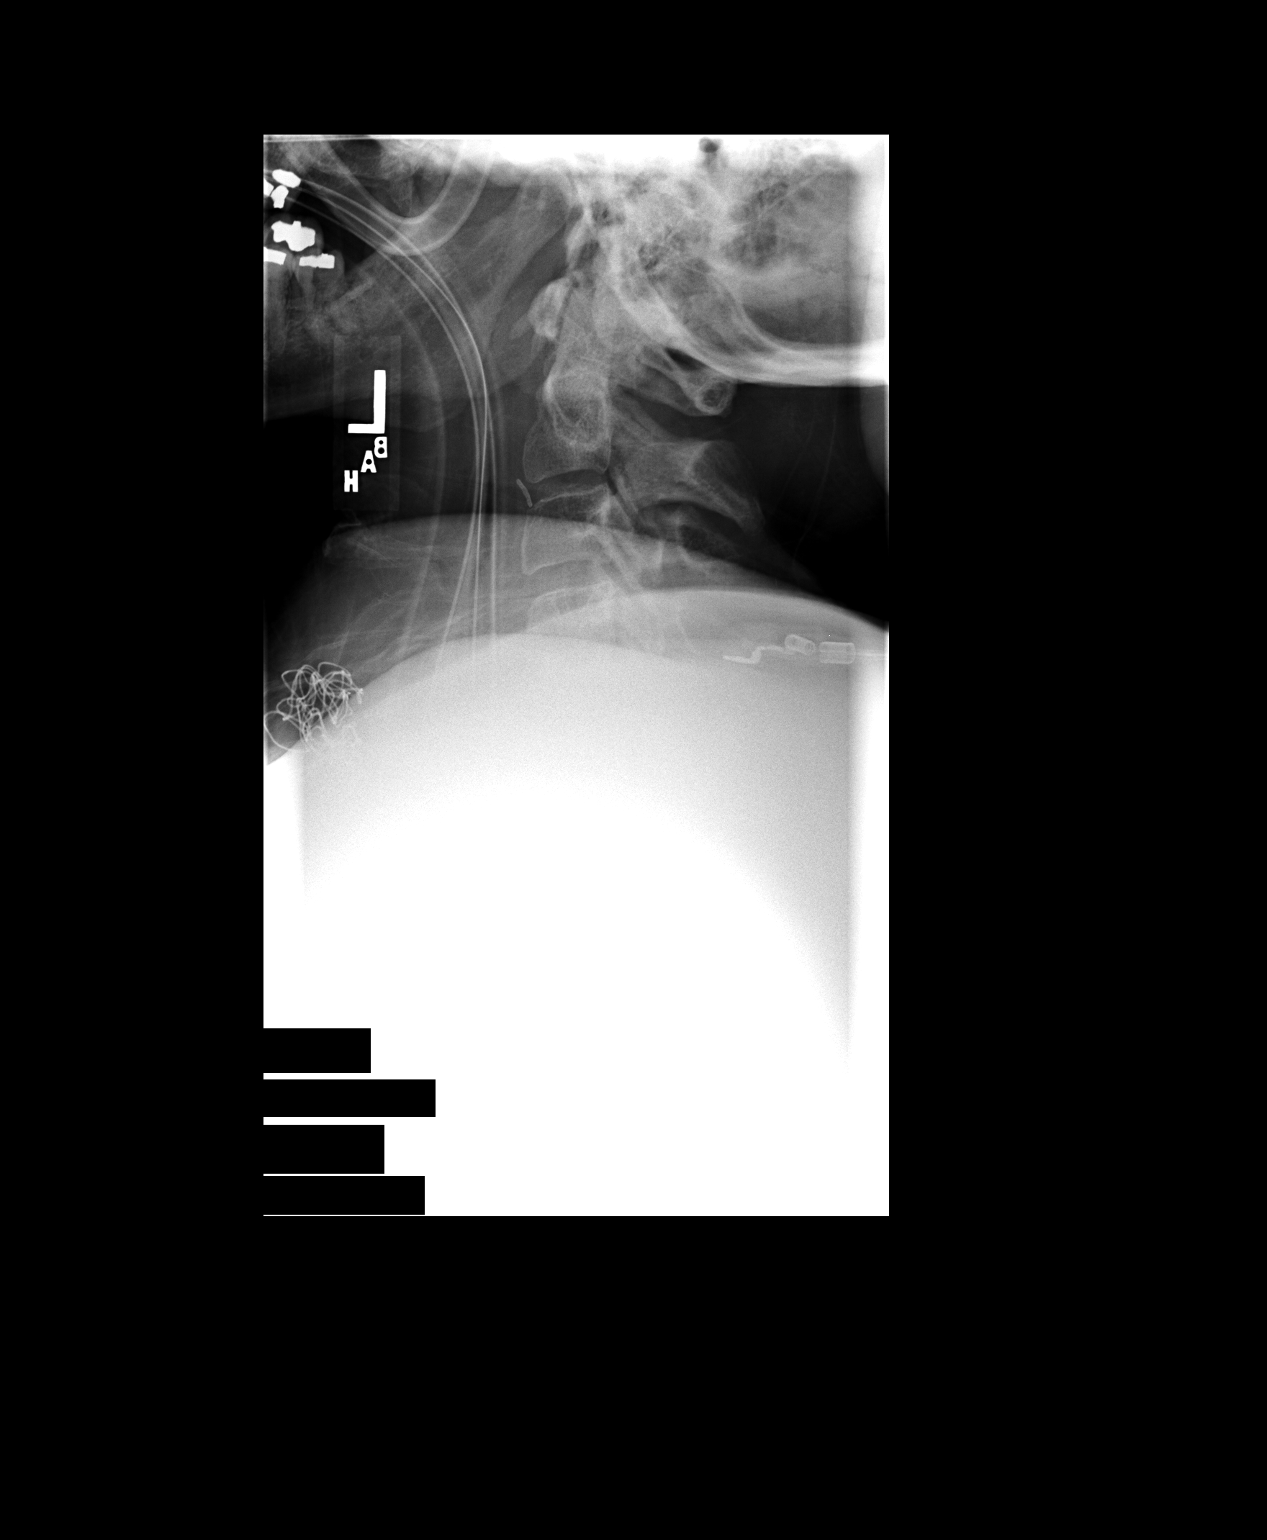

[1 of 1 positions shown; findings below may reference images not displayed]

FINDINGS: A single lateral cervical spine film from the operating
room demonstrates visualization of C2-C4.  No localization needle
is identified.
IMPRESSION: No localization needle can be identified.

## 2015-11-07 MED ORDER — GLIPIZIDE ER 10 MG TABLET, EXTENDED RELEASE 24 HR
ORAL | 0 days
Start: 2015-11-07 — End: ?

## 2016-01-16 ENCOUNTER — Encounter: Payer: Self-pay | Admitting: Vascular Surgery

## 2016-01-18 ENCOUNTER — Encounter: Payer: Self-pay | Admitting: Vascular Surgery

## 2016-01-18 ENCOUNTER — Other Ambulatory Visit: Payer: Self-pay

## 2016-01-18 ENCOUNTER — Ambulatory Visit (INDEPENDENT_AMBULATORY_CARE_PROVIDER_SITE_OTHER): Payer: PRIVATE HEALTH INSURANCE | Admitting: Vascular Surgery

## 2016-01-18 VITALS — BP 134/72 | HR 76 | Ht 66.0 in | Wt 255.5 lb

## 2016-01-18 DIAGNOSIS — I739 Peripheral vascular disease, unspecified: Secondary | ICD-10-CM

## 2016-01-18 NOTE — Progress Notes (Signed)
Referring Physician: No ref. provider found  Patient name: Kaitlyn Dougherty MRN: [id number] DOB: [birth year] Sex: female  REASON FOR CONSULT: Bilateral foot pain.  HPI: Kaitlyn Dougherty is a 55 y.o. female, who complains of right leg pain with walking and bilateral numbness and tingling in her feet. She states the numbness and tingling started about 3-4 weeks ago. About a half a block. She also complains of numbness and tingling in the toes on both feet. She describes this as a pins and needles and burning sensation. Her history is a little bit difficult but this seems to have been going on anywhere from a few weeks to 3-4 months. She has a previous history of bilateral iliac angioplasty by my partner Dr. Arbie Cookey in 2006. This was done in a fairly acute setting and she also required popliteal and tibial embolectomy by Dr. Hart Rochester at that time. Unfortunately she continues to smoke 1 pack of cigarettes per day. Greater than 3 minutes today spent regarding smoking cessation counseling. She states she does want to try to quit. She also has diabetes and states her glucose is usually in the 150-160 range. She states her blood pressure is been well-controlled. She also has elevated cholesterol. 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 She also had a cardiac stress test approximately 5 months ago at [place] which she said was negative. He does not have only nonhealing wounds on her feet.   Past Medical History  Diagnosis Date  . Diabetes mellitus without complication (HCC)   . Hyperlipidemia    Past Surgical History  Procedure Laterality Date  . Tubal ligation    . Tonsillectomy    . Iliac artery stent Bilateral 2006    No family history on file.  SOCIAL HISTORY: Social History   Social History  . Marital Status: Married    Spouse Name: N/A  . Number of Children: N/A  . Years of Education: N/A   Occupational History  . Not on file.   Social History Main Topics  . Smoking  status: Current Every Day Smoker -- 1.00 packs/day    Types: Cigarettes  . Smokeless tobacco: Never Used  . Alcohol Use: No  . Drug Use: No  . Sexual Activity: Not on file   Other Topics Concern  . Not on file   Social History Narrative  . No narrative on file    No Known Allergies  Current Outpatient Prescriptions  Medication Sig Dispense Refill  . aspirin 325 MG tablet Take 325 mg by mouth daily.    Marland Kitchen glipiZIDE (GLUCOTROL XL) 10 MG 24 hr tablet Take 10 mg by mouth every morning.  3  . HYDROcodone-acetaminophen (NORCO) 10-325 MG tablet     . lisinopril (PRINIVIL,ZESTRIL) 20 MG tablet Take by mouth.    . meloxicam (MOBIC) 15 MG tablet Take 15 mg by mouth daily.  3  . metFORMIN (GLUCOPHAGE-XR) 500 MG 24 hr tablet Take 2,000 mg by mouth daily.  3  . triamcinolone cream (KENALOG) 0.1 % Apply 1 application topically 2 (two) times daily.    Marland Kitchen VICTOZA 18 MG/3ML SOPN MILLIGRAM(S) INJECT 0.6 MILLIGRAM(S) SUBCUTANEOUSLY EVERY MORNING  5   No current facility-administered medications for this visit.    ROS:   General:  No weight loss, Fever, chills  HEENT: No recent headaches, no nasal bleeding, no visual changes, no sore throat  Neurologic: No dizziness, blackouts, seizures. No recent symptoms of stroke or mini- stroke. No recent episodes of slurred  speech, or temporary blindness.  Cardiac: No recent episodes of chest pain/pressure, no shortness of breath at rest. + shortness of breath with exertion.  Denies history of atrial fibrillation or irregular heartbeat  Vascular: No history of rest pain in feet.  + history of claudication.  No history of non-healing ulcer, No history of DVT   Pulmonary: No home oxygen, no productive cough, no hemoptysis,  No asthma or wheezing  Musculoskeletal:  [ ]  Arthritis, [x ] Low back pain,  [ ]  Joint pain  Hematologic:No history of hypercoagulable state.  No history of easy bleeding.  No history of anemia  Gastrointestinal: No hematochezia or  melena,  No gastroesophageal reflux, no trouble swallowing  Urinary: [ ]  chronic Kidney disease, [ ]  on HD - [ ]  MWF or [ ]  TTHS, [ ]  Burning with urination, [ ]  Frequent urination, [ ]  Difficulty urinating;   Skin: No rashes  Psychological: No history of anxiety,  No history of depression   Physical Examination  Filed Vitals:   01/18/16 0851 01/18/16 0858  BP: 153/71 134/72  Pulse: 76   Height: 5\' 6"  (1.676 m)   Weight: 255 lb 8 oz (115.894 kg)   SpO2: 95%     Body mass index is 41.26 kg/(m^2).  General:  Alert and oriented, no acute distress HEENT: Normal Neck: No bruit or JVD Pulmonary: Clear to auscultation bilaterally Cardiac: Regular Rate and Rhythm without murmur Abdomen: Soft, non-tender, non-distended, Obese Skin: No rash Extremity Pulses:  2+ right radial, 1+ left radial and brachial, absent femoral, dorsalis pedis, posterior tibial pulses bilaterally Musculoskeletal: No deformity or edema  Neurologic: Upper and lower extremity motor 5/5 and symmetric  DATA:  Patient had bilateral lower extremity AB[facility] and Do[facility]. 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 This was discussed with the patient today. Most likely she has reoccluded her prior iliac angioplasty sites. Although with multiple risk factors she certainly is at risk for progression of disease in her lower extremities as well. Risk factor modification of controlling her diabetes cholesterol hypertension and smoking cessation were all discussed at length today.   PLAN:  Aortogram with bilateral lower extremity runoff possible intervention scheduled for 02/09/2016. I did offer the patient  an arteriogram on May 12 but she stated she was going to be out of town from the 13th the 20th and wanted to put this off. I did discuss with the patient that if she has reoccluded her iliac arteries we may not be able to fix this with a percutaneous intervention and we would need to consider an operation instead. If she requires an operation she will need cardiac risk stratification prior to this. Risks benefits possible complications and procedure details including but not limited to bleeding infection contrast reaction vessel injury were explained to the patient today. I also explained to her that if we do an iliac intervention and improve her blood flow her to her feet if she has diabetic neuropathy this may not cure the numbness and tingling in her feet.   Fabienne Brunsharles Yukie Bergeron, MD [facility] Office: [phone] Pager: [phone]

## 2016-02-09 ENCOUNTER — Encounter (HOSPITAL_COMMUNITY)
Admission: RE | Disposition: A | Discharge status: Home / Self Care | Payer: Self-pay | Source: Ambulatory Visit | Attending: Vascular Surgery

## 2016-02-09 ENCOUNTER — Other Ambulatory Visit: Payer: Self-pay | Admitting: *Deleted

## 2016-02-09 ENCOUNTER — Ambulatory Visit (HOSPITAL_COMMUNITY)
Admission: RE | Admit: 2016-02-09 | Discharge: 2016-02-09 | Disposition: A | Discharge status: Home / Self Care | Payer: PRIVATE HEALTH INSURANCE | Source: Ambulatory Visit | Attending: Vascular Surgery | Admitting: Vascular Surgery

## 2016-02-09 DIAGNOSIS — I70213 Atherosclerosis of native arteries of extremities with intermittent claudication, bilateral legs: Secondary | ICD-10-CM | POA: Insufficient documentation

## 2016-02-09 DIAGNOSIS — Y812 Prosthetic and other implants, materials and accessory general- and plastic-surgery devices associated with adverse incidents: Secondary | ICD-10-CM | POA: Diagnosis not present

## 2016-02-09 DIAGNOSIS — E785 Hyperlipidemia, unspecified: Secondary | ICD-10-CM | POA: Diagnosis not present

## 2016-02-09 DIAGNOSIS — I739 Peripheral vascular disease, unspecified: Secondary | ICD-10-CM

## 2016-02-09 DIAGNOSIS — E1151 Type 2 diabetes mellitus with diabetic peripheral angiopathy without gangrene: Secondary | ICD-10-CM | POA: Diagnosis not present

## 2016-02-09 DIAGNOSIS — Z7984 Long term (current) use of oral hypoglycemic drugs: Secondary | ICD-10-CM | POA: Diagnosis not present

## 2016-02-09 DIAGNOSIS — T82856A Stenosis of peripheral vascular stent, initial encounter: Secondary | ICD-10-CM | POA: Insufficient documentation

## 2016-02-09 DIAGNOSIS — F17210 Nicotine dependence, cigarettes, uncomplicated: Secondary | ICD-10-CM | POA: Diagnosis not present

## 2016-02-09 DIAGNOSIS — Z7982 Long term (current) use of aspirin: Secondary | ICD-10-CM | POA: Insufficient documentation

## 2016-02-09 DIAGNOSIS — I10 Essential (primary) hypertension: Secondary | ICD-10-CM | POA: Insufficient documentation

## 2016-02-09 HISTORY — PX: PERIPHERAL VASCULAR CATHETERIZATION: SHX172C

## 2016-02-09 LAB — POCT I-STAT, CHEM 8
BUN: 11 mg/dL (ref 6–20)
CHLORIDE: 96 mmol/L — AB (ref 101–111)
Calcium, Ion: 1.3 mmol/L — ABNORMAL HIGH (ref 1.12–1.23)
Creatinine, Ser: 0.5 mg/dL (ref 0.44–1.00)
GLUCOSE: 244 mg/dL — AB (ref 65–99)
HCT: 46 % (ref 36.0–46.0)
Hemoglobin: 15.6 g/dL — ABNORMAL HIGH (ref 12.0–15.0)
POTASSIUM: 4.1 mmol/L (ref 3.5–5.1)
Sodium: 134 mmol/L — ABNORMAL LOW (ref 135–145)
TCO2: 27 mmol/L (ref 0–100)

## 2016-02-09 LAB — GLUCOSE, CAPILLARY
GLUCOSE-CAPILLARY: 257 mg/dL — AB (ref 65–99)
Glucose-Capillary: 220 mg/dL — ABNORMAL HIGH (ref 65–99)

## 2016-02-09 SURGERY — ABDOMINAL AORTOGRAM W/LOWER EXTREMITY

## 2016-02-09 MED ORDER — LABETALOL HCL 5 MG/ML IV SOLN
10.0000 mg | INTRAVENOUS | Status: DC | PRN
  Administered 2016-02-09: 10 mg via INTRAVENOUS

## 2016-02-09 MED ORDER — MORPHINE SULFATE (PF) 4 MG/ML IV SOLN
INTRAVENOUS | Status: AC
  Filled 2016-02-09: qty 1

## 2016-02-09 MED ORDER — MORPHINE SULFATE (PF) 10 MG/ML IV SOLN
2.0000 mg | INTRAVENOUS | Status: DC | PRN
  Administered 2016-02-09: 4 mg via INTRAVENOUS

## 2016-02-09 MED ORDER — LABETALOL HCL 5 MG/ML IV SOLN
INTRAVENOUS | Status: AC
Start: 2016-02-09 — End: 2016-02-09
  Filled 2016-02-09: qty 4

## 2016-02-09 MED ORDER — HEPARIN (PORCINE) IN NACL 2-0.9 UNIT/ML-% IJ SOLN
INTRAMUSCULAR | Status: AC
  Filled 2016-02-09: qty 1000

## 2016-02-09 MED ORDER — IODIXANOL 320 MG/ML IV SOLN
INTRAVENOUS | Status: DC | PRN
  Administered 2016-02-09: 80 mL via INTRAVENOUS

## 2016-02-09 MED ORDER — SODIUM CHLORIDE 0.45 % IV SOLN
INTRAVENOUS | Status: DC
  Administered 2016-02-09: 09:00:00 via INTRAVENOUS

## 2016-02-09 MED ORDER — SODIUM CHLORIDE 0.9 % IV SOLN
INTRAVENOUS | Status: DC
  Administered 2016-02-09: 07:00:00 via INTRAVENOUS

## 2016-02-09 MED ORDER — HEPARIN (PORCINE) IN NACL 2-0.9 UNIT/ML-% IJ SOLN
INTRAMUSCULAR | Status: DC | PRN
  Administered 2016-02-09: 1000 mL

## 2016-02-09 MED ORDER — LIDOCAINE HCL (PF) 1 % IJ SOLN
INTRAMUSCULAR | Status: AC
  Filled 2016-02-09: qty 30

## 2016-02-09 MED ORDER — HYDRALAZINE HCL 20 MG/ML IJ SOLN
INTRAMUSCULAR | Status: AC
  Filled 2016-02-09: qty 1

## 2016-02-09 MED ORDER — HYDRALAZINE HCL 20 MG/ML IJ SOLN
5.0000 mg | INTRAMUSCULAR | Status: AC | PRN
  Administered 2016-02-09 (×2): 5 mg via INTRAVENOUS

## 2016-02-09 MED ORDER — LIDOCAINE HCL (PF) 1 % IJ SOLN
INTRAMUSCULAR | Status: DC | PRN
  Administered 2016-02-09: 40 mL via SUBCUTANEOUS

## 2016-02-09 MED ORDER — OXYCODONE-ACETAMINOPHEN 5-325 MG PO TABS: 1.0000 | ORAL_TABLET | ORAL | Status: DC | PRN

## 2016-02-09 SURGICAL SUPPLY — 10 items
CATH ANGIO 5F BER2 65CM (CATHETERS) ×2 IMPLANT
CATH ANGIO 5F PIGTAIL 65CM (CATHETERS) ×2 IMPLANT
COVER PRB 48X5XTLSCP FOLD TPE (BAG) ×1 IMPLANT
COVER PROBE 5X48 (BAG) ×1
KIT PV (KITS) ×2 IMPLANT
SHEATH PINNACLE 5F 10CM (SHEATH) ×4 IMPLANT
SYR MEDRAD MARK V 150ML (SYRINGE) ×2 IMPLANT
TRANSDUCER W/STOPCOCK (MISCELLANEOUS) ×2 IMPLANT
TRAY PV CATH (CUSTOM PROCEDURE TRAY) ×2 IMPLANT
WIRE HITORQ VERSACORE ST 145CM (WIRE) ×2 IMPLANT

## 2016-02-09 NOTE — Progress Notes (Signed)
Site area: rt groin Site Prior to Removal:  Level  0 Pressure Applied For:  20 minutes Manual:   yes Patient Status During Pull:  stable Post Pull Site:  Level  0 Post Pull Instructions Given:  yes Post Pull Pulses Present: yes Dressing Applied:  tegaderm Bedrest begins @  Comments:   

## 2016-02-09 NOTE — H&P (View-Only) (Signed)
  Referring Physician: No ref. provider found  Patient name: Kaitlyn Dougherty MRN: [id number] DOB: 04/21/1961 Sex: female  REASON FOR CONSULT: Bilateral foot pain.  HPI: Kaitlyn Dougherty is a 54 y.o. female, who complains of right leg pain with walking and bilateral numbness and tingling in her feet. She states the numbness and tingling started about 3-4 weeks ago. About a half a block. She also complains of numbness and tingling in the toes on both feet. She describes this as a pins and needles and burning sensation. Her history is a little bit difficult but this seems to have been going on anywhere from a few weeks to 3-4 months. She has a previous history of bilateral iliac angioplasty by my partner Dr. Early in 2006. This was done in a fairly acute setting and she also required popliteal and tibial embolectomy by Dr. Lawson at that time. Unfortunately she continues to smoke 1 pack of cigarettes per day. Greater than 3 minutes today spent regarding smoking cessation counseling. She states she does want to try to quit. She also has diabetes and states her glucose is usually in the 150-160 range. She states her blood pressure is been well-controlled. She also has elevated cholesterol. 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 She also had a cardiac stress test approximately 5 months ago at [employer] which she said was negative. He does not have only nonhealing wounds on her feet.   Past Medical History  Diagnosis Date  . Diabetes mellitus without complication (HCC)   . Hyperlipidemia    Past Surgical History  Procedure Laterality Date  . Tubal ligation    . Tonsillectomy    . Iliac artery stent Bilateral 2006    No family history on file.  SOCIAL HISTORY: Social History   Social History  . Marital Status: Married    Spouse Name: N/A  . Number of Children: N/A  . Years of Education: N/A   Occupational History  . Not on file.   Social History Main Topics  . Smoking  status: Current Every Day Smoker -- 1.00 packs/day    Types: Cigarettes  . Smokeless tobacco: Never Used  . Alcohol Use: No  . Drug Use: No  . Sexual Activity: Not on file   Other Topics Concern  . Not on file   Social History Narrative  . No narrative on file    No Known Allergies  Current Outpatient Prescriptions  Medication Sig Dispense Refill  . aspirin 325 MG tablet Take 325 mg by mouth daily.    . glipiZIDE (GLUCOTROL XL) 10 MG 24 hr tablet Take 10 mg by mouth every morning.  3  . HYDROcodone-acetaminophen (NORCO) 10-325 MG tablet     . lisinopril (PRINIVIL,ZESTRIL) 20 MG tablet Take by mouth.    . meloxicam (MOBIC) 15 MG tablet Take 15 mg by mouth daily.  3  . metFORMIN (GLUCOPHAGE-XR) 500 MG 24 hr tablet Take 2,000 mg by mouth daily.  3  . triamcinolone cream (KENALOG) 0.1 % Apply 1 application topically 2 (two) times daily.    . VICTOZA 18 MG/3ML SOPN MILLIGRAM(S) INJECT 0.6 MILLIGRAM(S) SUBCUTANEOUSLY EVERY MORNING  5   No current facility-administered medications for this visit.    ROS:   General:  No weight loss, Fever, chills  HEENT: No recent headaches, no nasal bleeding, no visual changes, no sore throat  Neurologic: No dizziness, blackouts, seizures. No recent symptoms of stroke or mini- stroke. No recent episodes of slurred   speech, or temporary blindness.  Cardiac: No recent episodes of chest pain/pressure, no shortness of breath at rest. + shortness of breath with exertion.  Denies history of atrial fibrillation or irregular heartbeat  Vascular: No history of rest pain in feet.  + history of claudication.  No history of non-healing ulcer, No history of DVT   Pulmonary: No home oxygen, no productive cough, no hemoptysis,  No asthma or wheezing  Musculoskeletal:  [ ]  Arthritis, [x ] Low back pain,  [ ]  Joint pain  Hematologic:No history of hypercoagulable state.  No history of easy bleeding.  No history of anemia  Gastrointestinal: No hematochezia or  melena,  No gastroesophageal reflux, no trouble swallowing  Urinary: [ ]  chronic Kidney disease, [ ]  on HD - [ ]  MWF or [ ]  TTHS, [ ]  Burning with urination, [ ]  Frequent urination, [ ]  Difficulty urinating;   Skin: No rashes  Psychological: No history of anxiety,  No history of depression   Physical Examination  Filed Vitals:   01/18/16 0851 01/18/16 0858  BP: 153/71 134/72  Pulse: 76   Height: 5\' 6"  (1.676 m)   Weight: 255 lb 8 oz (115.894 kg)   SpO2: 95%     Body mass index is 41.26 kg/(m^2).  General:  Alert and oriented, no acute distress HEENT: Normal Neck: No bruit or JVD Pulmonary: Clear to auscultation bilaterally Cardiac: Regular Rate and Rhythm without murmur Abdomen: Soft, non-tender, non-distended, Obese Skin: No rash Extremity Pulses:  2+ right radial, 1+ left radial and brachial, absent femoral, dorsalis pedis, posterior tibial pulses bilaterally Musculoskeletal: No deformity or edema  Neurologic: Upper and lower extremity motor 5/5 and symmetric  DATA:  Patient had bilateral lower extremity ABIs and Dopp[facility]/16/[facility]. 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 This was discussed with the patient today. Most likely she has reoccluded her prior iliac angioplasty sites. Although with multiple risk factors she certainly is at risk for progression of disease in her lower extremities as well. Risk factor modification of controlling her diabetes cholesterol hypertension and smoking cessation were all discussed at length today.   PLAN:  Aortogram with bilateral lower extremity runoff possible intervention scheduled for 02/09/2016. I did offer the patient  an arteriogram on May 12 but she stated she was going to be out of town from the 13th the 20th and wanted to put this off. I did discuss with the patient that if she has reoccluded her iliac arteries we may not be able to fix this with a percutaneous intervention and we would need to consider an operation instead. If she requires an operation she will need cardiac risk stratification prior to this. Risks benefits possible complications and procedure details including but not limited to bleeding infection contrast reaction vessel injury were explained to the patient today. I also explained to her that if we do an iliac intervention and improve her blood flow her to her feet if she has diabetic neuropathy this may not cure the numbness and tingling in her feet.   Fabienne Brunsharles Rozanne Heumann, MD [facility] [place] Office: [phone] Pager: [phone]

## 2016-02-09 NOTE — Progress Notes (Signed)
Site area: LFA Site Prior to Removal:  Level 0 Pressure Applied For:20 min Manual: yes   Patient Status During Pull: stable  Post Pull Site:  Level 0 Post Pull Instructions Given: yes  Post Pull Pulses Present: doppler Dressing Applied:  tegaderm Bedrest begins @ 10am Comments:

## 2016-02-09 NOTE — Discharge Instructions (Signed)
Angiogram, Care After °Refer to this sheet in the next few weeks. These instructions provide you with information about caring for yourself after your procedure. Your health care provider may also give you more specific instructions. Your treatment has been planned according to current medical practices, but problems sometimes occur. Call your health care provider if you have any problems or questions after your procedure. °WHAT TO EXPECT AFTER THE PROCEDURE °After your procedure, it is typical to have the following: °· Bruising at the catheter insertion site that usually fades within 1-2 weeks. °· Blood collecting in the tissue (hematoma) that may be painful to the touch. It should usually decrease in size and tenderness within 1-2 weeks. °HOME CARE INSTRUCTIONS °· Take medicines only as directed by your health care provider. °· You may shower 24-48 hours after the procedure or as directed by your health care provider. Remove the bandage (dressing) and gently wash the site with plain soap and water. Pat the area dry with a clean towel. Do not rub the site, because this may cause bleeding. °· Do not take baths, swim, or use a hot tub until your health care provider approves. °· Check your insertion site every day for redness, swelling, or drainage. °· Do not apply powder or lotion to the site. °· Do not lift over 10 lb (4.5 kg) for 5 days after your procedure or as directed by your health care provider. °· Ask your health care provider when it is okay to: °¨ Return to work or school. °¨ Resume usual physical activities or sports. °¨ Resume sexual activity. °· Do not drive home if you are discharged the same day as the procedure. Have someone else drive you. °· You may drive 24 hours after the procedure unless otherwise instructed by your health care provider. °· Do not operate machinery or power tools for 24 hours after the procedure or as directed by your health care provider. °· If your procedure was done as an  outpatient procedure, which means that you went home the same day as your procedure, a responsible adult should be with you for the first 24 hours after you arrive home. °· Keep all follow-up visits as directed by your health care provider. This is important. °SEEK MEDICAL CARE IF: °· You have a fever. °· You have chills. °· You have increased bleeding from the catheter insertion site. Hold pressure on the site. °SEEK IMMEDIATE MEDICAL CARE IF: °· You have unusual pain at the catheter insertion site. °· You have redness, warmth, or swelling at the catheter insertion site. °· You have drainage (other than a small amount of blood on the dressing) from the catheter insertion site. °· The catheter insertion site is bleeding, and the bleeding does not stop after 30 minutes of holding steady pressure on the site. °· The area near or just beyond the catheter insertion site becomes pale, cool, tingly, or numb. °  °This information is not intended to replace advice given to you by your health care provider. Make sure you discuss any questions you have with your health care provider. °  °Document Released: 03/21/2005 Document Revised: 09/23/2014 Document Reviewed: 02/03/2013 °Elsevier Interactive Patient Education ©2016 [employer] ° °

## 2016-02-09 NOTE — Interval H&P Note (Signed)
History and Physical Interval Note:  02/09/2016 7:39 AM  Kaitlyn MortonJulie B Renfro  has presented today for surgery, with the diagnosis of pvd with bilateral leg claudication  The various methods of treatment have been discussed with the patient and family. After consideration of risks, benefits and other options for treatment, the patient has consented to  Procedure(s): Abdominal Aortogram w/Lower Extremity (N/A) as a surgical intervention .  The patient's history has been reviewed, patient examined, no change in status, stable for surgery.  I have reviewed the patient's chart and labs.  Questions were answered to the patient's satisfaction.     Fabienne BrunsFields, Naydeen Speirs

## 2016-02-09 NOTE — Op Note (Signed)
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 The patient was placed in supine position the Angio table. Both groins were prepped and draped in usual sterile fashion. Local anesthesia was then threaded over the left common femoral artery. Ultrasound was used to identify the left common femoral artery. However, due to the patient's severe obesity, the common femoral artery and vein were very difficult to visualize due to the depth of the vessel. At this point fluoroscopy was used to identify the left femoral head. Using anatomic landmarks and fluoroscopic guidance left common femoral artery was successfully cannulated and a 035 versacore wire threaded up into the left hemipelvis. At this point a 5 [place] sheath was placed over the guidewire and the left common femoral artery and this was thoroughly flushed with heparinized saline. I then attempted to advance the guidewire across a pre-existing left common iliac stent and this would not advance. A 5 [place] Berenstein catheter was placed over the guidewire and an attempt to direct the catheter and the wire still would not advance. Contrast was then injected through the Berenstein catheter which showed that the left common iliac stent was occluded. The left external iliac and internal iliac arteries were widely patent. Attention was then turned to the right groin. In similar fashion local anesthesia was infiltrated over the right common femoral artery. Again using fluoroscopy and anatomic landmarks the right common femoral artery was successfully cannulated and a 35 versacore wire threaded up in the right  hemipelvis. A 5 French sheath was placed over this. The 5 French sheath was thoroughly flushed with heparin saline. I then attempted to advance the versacore wire across the right common iliac stent and this would not advance. Therefore at this point the [facility] catheter was also placed over the guidewire and contrast injected on the right side which showed chronic occlusion of the right common iliac stent. At this point we proceeded to do bilateral lower extremity runoff views using the sheath on each side.  In the left lower extremity, the left external iliac common femoral profunda femoris superficial femoral popliteal anterior tibial posterior tibial and peroneal arteries are all widely patent.  In the right lower extremity, the right external iliac common femoral profunda femoris superficial femoral popliteal anterior tibial posterior tibial and peroneal arteries are all widely patent.  At this point the procedure was concluded the patient was taken back to the holding area with both sheaths in place to be pulled in the holding area. The patient tolerated the procedure well and there were no complications.  Operative management: The patient has occlusion of her left and right common iliac stents. Due to her severe obesity, she is not a good candidate for aortobifemoral bypass grafting as she would be very high risk of infection. At this point I believe the best option for her is smoking cessation and a walking program of 30 minutes daily. She currently does not have any open wounds on her feet and does not have an appearance of severe ischemia. With her intact runoff she should be able to compensate to a certain degree and maintaining perfusion of her lower extremities without limb loss. However, if in the future her feet deteriorate to the point where  she has open wounds we would reconsider whether or not an aortobifemoral bypass graft would be in her best interest. However if she requires some  point in the future her risk of graft infection or groin problems would probably be on the order of 25-30% and I'm currently not willing to take that risk with her. The patient will follow-up with me in 6 months time for repeat ABIs she will try her best to be compliant with smoking cessation and walking.  Fabienne Brunsharles Fields, MD [facility] [place] Office: [phone] Pager: [phone]

## 2016-02-13 ENCOUNTER — Encounter (HOSPITAL_COMMUNITY): Payer: Self-pay | Admitting: Vascular Surgery

## 2016-02-13 MED FILL — Morphine Sulfate Inj 4 MG/ML: INTRAMUSCULAR | Qty: 1 | Status: AC

## 2016-06-20 ENCOUNTER — Ambulatory Visit (INDEPENDENT_AMBULATORY_CARE_PROVIDER_SITE_OTHER): Payer: PRIVATE HEALTH INSURANCE | Admitting: Otolaryngology

## 2016-07-04 ENCOUNTER — Ambulatory Visit (INDEPENDENT_AMBULATORY_CARE_PROVIDER_SITE_OTHER): Payer: PRIVATE HEALTH INSURANCE | Admitting: Otolaryngology

## 2016-08-07 ENCOUNTER — Encounter: Payer: Self-pay | Admitting: Family

## 2016-08-15 ENCOUNTER — Ambulatory Visit: Payer: PRIVATE HEALTH INSURANCE | Admitting: Family

## 2016-08-15 ENCOUNTER — Encounter (HOSPITAL_COMMUNITY): Payer: PRIVATE HEALTH INSURANCE

## 2016-11-01 ENCOUNTER — Inpatient Hospital Stay (HOSPITAL_COMMUNITY)
Admission: EM | Admit: 2016-11-01 | Discharge: 2016-11-07 | DRG: 571 | Disposition: A | Discharge status: Home / Self Care | Payer: PRIVATE HEALTH INSURANCE | Attending: Internal Medicine | Admitting: Internal Medicine

## 2016-11-01 ENCOUNTER — Emergency Department (HOSPITAL_COMMUNITY): Payer: PRIVATE HEALTH INSURANCE

## 2016-11-01 ENCOUNTER — Encounter (HOSPITAL_COMMUNITY): Payer: Self-pay | Admitting: Emergency Medicine

## 2016-11-01 DIAGNOSIS — L02415 Cutaneous abscess of right lower limb: Principal | ICD-10-CM | POA: Diagnosis present

## 2016-11-01 DIAGNOSIS — Z6832 Body mass index (BMI) 32.0-32.9, adult: Secondary | ICD-10-CM

## 2016-11-01 DIAGNOSIS — D649 Anemia, unspecified: Secondary | ICD-10-CM | POA: Diagnosis present

## 2016-11-01 DIAGNOSIS — E871 Hypo-osmolality and hyponatremia: Secondary | ICD-10-CM | POA: Diagnosis present

## 2016-11-01 DIAGNOSIS — I10 Essential (primary) hypertension: Secondary | ICD-10-CM | POA: Diagnosis present

## 2016-11-01 DIAGNOSIS — Z9109 Other allergy status, other than to drugs and biological substances: Secondary | ICD-10-CM

## 2016-11-01 DIAGNOSIS — E11649 Type 2 diabetes mellitus with hypoglycemia without coma: Secondary | ICD-10-CM | POA: Diagnosis present

## 2016-11-01 DIAGNOSIS — Z7984 Long term (current) use of oral hypoglycemic drugs: Secondary | ICD-10-CM

## 2016-11-01 DIAGNOSIS — R000 Tachycardia, unspecified: Secondary | ICD-10-CM | POA: Diagnosis present

## 2016-11-01 DIAGNOSIS — T502X5A Adverse effect of carbonic-anhydrase inhibitors, benzothiadiazides and other diuretics, initial encounter: Secondary | ICD-10-CM | POA: Diagnosis present

## 2016-11-01 DIAGNOSIS — E119 Type 2 diabetes mellitus without complications: Secondary | ICD-10-CM

## 2016-11-01 DIAGNOSIS — Z7982 Long term (current) use of aspirin: Secondary | ICD-10-CM | POA: Diagnosis not present

## 2016-11-01 DIAGNOSIS — Z79899 Other long term (current) drug therapy: Secondary | ICD-10-CM | POA: Diagnosis not present

## 2016-11-01 DIAGNOSIS — Z79891 Long term (current) use of opiate analgesic: Secondary | ICD-10-CM | POA: Diagnosis not present

## 2016-11-01 DIAGNOSIS — E7800 Pure hypercholesterolemia, unspecified: Secondary | ICD-10-CM | POA: Diagnosis not present

## 2016-11-01 DIAGNOSIS — E118 Type 2 diabetes mellitus with unspecified complications: Secondary | ICD-10-CM | POA: Diagnosis not present

## 2016-11-01 DIAGNOSIS — Z87891 Personal history of nicotine dependence: Secondary | ICD-10-CM

## 2016-11-01 DIAGNOSIS — E1142 Type 2 diabetes mellitus with diabetic polyneuropathy: Secondary | ICD-10-CM | POA: Diagnosis present

## 2016-11-01 DIAGNOSIS — Z8249 Family history of ischemic heart disease and other diseases of the circulatory system: Secondary | ICD-10-CM | POA: Diagnosis not present

## 2016-11-01 DIAGNOSIS — E785 Hyperlipidemia, unspecified: Secondary | ICD-10-CM | POA: Diagnosis present

## 2016-11-01 DIAGNOSIS — E1151 Type 2 diabetes mellitus with diabetic peripheral angiopathy without gangrene: Secondary | ICD-10-CM | POA: Diagnosis present

## 2016-11-01 DIAGNOSIS — E669 Obesity, unspecified: Secondary | ICD-10-CM | POA: Diagnosis present

## 2016-11-01 DIAGNOSIS — L03115 Cellulitis of right lower limb: Secondary | ICD-10-CM | POA: Diagnosis present

## 2016-11-01 HISTORY — DX: Essential (primary) hypertension: I10

## 2016-11-01 HISTORY — DX: Non-pressure chronic ulcer of skin of other sites with unspecified severity: L98.499

## 2016-11-01 HISTORY — DX: Peripheral vascular disease, unspecified: I73.9

## 2016-11-01 HISTORY — DX: Type 2 diabetes mellitus with diabetic polyneuropathy: E11.42

## 2016-11-01 LAB — CBC WITH DIFFERENTIAL/PLATELET
Basophils Absolute: 0 10*3/uL (ref 0.0–0.1)
Basophils Relative: 0 %
EOS ABS: 0 10*3/uL (ref 0.0–0.7)
Eosinophils Relative: 0 %
HEMATOCRIT: 31.1 % — AB (ref 36.0–46.0)
HEMOGLOBIN: 10.2 g/dL — AB (ref 12.0–15.0)
LYMPHS ABS: 1.1 10*3/uL (ref 0.7–4.0)
LYMPHS PCT: 11 %
MCH: 28.7 pg (ref 26.0–34.0)
MCHC: 32.8 g/dL (ref 30.0–36.0)
MCV: 87.6 fL (ref 78.0–100.0)
MONOS PCT: 9 %
Monocytes Absolute: 0.8 10*3/uL (ref 0.1–1.0)
Neutro Abs: 7.7 10*3/uL (ref 1.7–7.7)
Neutrophils Relative %: 80 %
Platelets: 275 10*3/uL (ref 150–400)
RBC: 3.55 MIL/uL — ABNORMAL LOW (ref 3.87–5.11)
RDW: 14.7 % (ref 11.5–15.5)
WBC: 9.6 10*3/uL (ref 4.0–10.5)

## 2016-11-01 LAB — LACTIC ACID, PLASMA
Lactic Acid, Venous: 0.8 mmol/L (ref 0.5–1.9)
Lactic Acid, Venous: 0.7 mmol/L (ref 0.5–1.9)

## 2016-11-01 LAB — BASIC METABOLIC PANEL
Anion gap: 11 (ref 5–15)
BUN: 11 mg/dL (ref 6–20)
CHLORIDE: 93 mmol/L — AB (ref 101–111)
CO2: 25 mmol/L (ref 22–32)
CREATININE: 0.75 mg/dL (ref 0.44–1.00)
Calcium: 9.4 mg/dL (ref 8.9–10.3)
GFR calc Af Amer: >60 mL/min (ref >60)
GFR calc non Af Amer: >60 mL/min (ref >60)
GLUCOSE: 182 mg/dL — AB (ref 65–99)
Potassium: 3.9 mmol/L (ref 3.5–5.1)
SODIUM: 129 mmol/L — AB (ref 135–145)

## 2016-11-01 MED ORDER — KETOROLAC TROMETHAMINE 30 MG/ML IJ SOLN
30.0000 mg | Freq: Once | INTRAMUSCULAR | Status: AC
  Administered 2016-11-01: 30 mg via INTRAVENOUS

## 2016-11-01 MED ORDER — CLINDAMYCIN PHOSPHATE 900 MG/50ML IV SOLN
900.0000 mg | Freq: Once | INTRAVENOUS | Status: AC
  Administered 2016-11-01: 900 mg via INTRAVENOUS
  Filled 2016-11-01: qty 50

## 2016-11-01 MED ORDER — SODIUM CHLORIDE 0.9 % IV BOLUS (SEPSIS)
1000.0000 mL | Freq: Once | INTRAVENOUS | Status: AC
  Administered 2016-11-01: 1000 mL via INTRAVENOUS

## 2016-11-01 MED ORDER — KETOROLAC TROMETHAMINE 30 MG/ML IJ SOLN
INTRAMUSCULAR | Status: AC
  Filled 2016-11-01: qty 1

## 2016-11-01 MED ORDER — IOPAMIDOL (ISOVUE-300) INJECTION 61%
100.0000 mL | Freq: Once | INTRAVENOUS | Status: AC | PRN
  Administered 2016-11-01: 100 mL via INTRAVENOUS

## 2016-11-01 MED ORDER — MORPHINE SULFATE (PF) 4 MG/ML IV SOLN
4.0000 mg | INTRAVENOUS | Status: AC | PRN
  Administered 2016-11-01 (×2): 4 mg via INTRAVENOUS
  Filled 2016-11-01 (×2): qty 1

## 2016-11-01 MED ORDER — ONDANSETRON HCL 4 MG/2ML IJ SOLN
4.0000 mg | INTRAMUSCULAR | Status: DC | PRN
  Administered 2016-11-01: 4 mg via INTRAVENOUS
  Filled 2016-11-01: qty 2

## 2016-11-01 NOTE — ED Notes (Signed)
Pt to xray

## 2016-11-01 NOTE — ED Provider Notes (Signed)
[facility]-EMER[facility]Y DEPT Provider Note   CSN: [id number] Arrival date & time: 11/01/16  1850     History   Chief Complaint Chief Complaint  Patient presents with  . Fever  . Abscess    R inner thigh since weds    HPI Kaitlyn FUJII is a 56 y.o. female.  HPI  Pt was seen at 1920. Per pt and her family, c/o gradual onset and worsening of persistent "red area" to her right upper inner thigh that began 4 days ago. Pt states her PMD called in an antibiotic yesterday and she has been taking it as prescribed. Pt states the area "is getting worse" since starting the antibiotic. Has been associated with home fevers to "103" and "drainage with foul odor" from the area.  Pt states she has hx of "sepsis" due to abscess previously and is concerned "about getting that again." Denies any other areas of rash. Denies perineal rash. Denies focal motor weakness, no tingling/numbness in extremities, no injury.   Past Medical History:  Diagnosis Date  . Diabetes mellitus without complication (HCC)   . Hyperlipidemia   . Ulcer of abdomen wall (HCC)     There are no active problems to display for this patient.   Past Surgical History:  Procedure Laterality Date  . ILIAC ARTERY STENT Bilateral 2006  . PERIPHERAL VASCULAR CATHETERIZATION N/A 02/09/2016   Procedure: Abdominal Aortogram w/Lower Extremity;  Surgeon: Sherren Kerns, MD;  Location: [facility] INVASIVE CV LAB;  Service: Cardiovascular;  Laterality: N/A;  . TONSILLECTOMY    . TUBAL LIGATION      OB History    No data available       Home Medications    Prior to Admission medications   Medication Sig Start Date End Date Taking? Authorizing Provider  amLODipine (NORVASC) 10 MG tablet Take 10 mg by mouth daily. 10/20/16  Yes Historical Provider, MD  aspirin 81 MG EC tablet Take 81 mg by mouth daily. 10/01/16  Yes Historical Provider, MD  atorvastatin (LIPITOR) 20 MG tablet Take 20 mg by mouth daily.   Yes Historical Provider, MD  glipiZIDE  (GLUCOTROL XL) 10 MG 24 hr tablet Take 10 mg by mouth every morning. 11/07/15  Yes Historical Provider, MD  hydrochlorothiazide (MICROZIDE) 12.5 MG capsule Take 12.5 mg by mouth daily. 09/17/16  Yes Historical Provider, MD  KLOR-CON M10 10 MEQ tablet Take 10 mEq by mouth daily. 09/20/16  Yes Historical Provider, MD  lisinopril (PRINIVIL,ZESTRIL) 20 MG tablet Take 20 mg by mouth daily.    Yes Historical Provider, MD  meloxicam (MOBIC) 15 MG tablet Take 15 mg by mouth daily. 12/04/15  Yes Historical Provider, MD  metFORMIN (GLUCOPHAGE-XR) 500 MG 24 hr tablet Take 1,000 mg by mouth 2 (two) times daily.  10/26/15  Yes Historical Provider, MD  oxyCODONE-acetaminophen (PERCOCET/ROXICET) 5-325 MG tablet TAKE 1 TABLET BY MOUTH TWO TIMES DAILY AS NEEDED FOR PAIN 10/04/16  Yes Historical Provider, MD  pantoprazole (PROTONIX) 20 MG tablet Take 20 mg by mouth daily.  12/27/15  Yes Historical Provider, MD  PROAIR HFA 108 (90 Base) MCG/ACT inhaler Inhale 2 puffs into the lungs every 6 (six) hours as needed for wheezing or shortness of breath.  11/06/15  Yes Historical Provider, MD  sulfamethoxazole-trimethoprim (BACTRIM DS,SEPTRA DS) 800-160 MG tablet Take 1 tablet by mouth 2 (two) times daily.   Yes Historical Provider, MD  traMADol (ULTRAM) 50 MG tablet Take 100 mg by mouth 3 (three) times daily. 10/03/16  Yes Historical  Provider, MD  mometasone (ELOCON) 0.1 % lotion Place 2 drops into both ears 2 (two) times daily as needed (for ears).  11/15/15   Historical Provider, MD    Family History No family history on file.  Social History Social History  Substance Use Topics  . Smoking status: Former Smoker    Packs/day: 0.00  . Smokeless tobacco: Never Used  . Alcohol use No     Allergies   Adhesive [tape]   Review of Systems Review of Systems ROS: Statement: All systems negative except as marked or noted in the HPI; Constitutional: +fever and chills. ; ; Eyes: Negative for eye pain, redness and discharge. ; ;  ENMT: Negative for ear pain, hoarseness, nasal congestion, sinus pressure and sore throat. ; ; Cardiovascular: Negative for chest pain, palpitations, diaphoresis, dyspnea and peripheral edema. ; ; Respiratory: Negative for cough, wheezing and stridor. ; ; Gastrointestinal: Negative for nausea, vomiting, diarrhea, abdominal pain, blood in stool, hematemesis, jaundice and rectal bleeding. . ; ; Genitourinary: Negative for dysuria, flank pain and hematuria. ; ; Musculoskeletal: Negative for back pain and neck pain. Negative for swelling and trauma.; ; Skin: Negative for pruritus, abrasions, blisters, bruising and +rash, abscess..; ; Neuro: Negative for headache, lightheadedness and neck stiffness. Negative for weakness, altered level of consciousness, altered mental status, extremity weakness, paresthesias, involuntary movement, seizure and syncope.      Physical Exam Updated Vital Signs BP 117/64 (BP Location: Right Arm)   Pulse 92   Temp 99.6 F (37.6 C) (Oral)   Resp 18   Ht 5\' 6"  (1.676 m)   Wt 200 lb (90.7 kg)   SpO2 95%   BMI 32.28 kg/m   Physical Exam 1925: Physical examination:  Nursing notes reviewed; Vital signs and O2 SAT reviewed;  Constitutional: Well developed, Well nourished, Well hydrated, Uncomfortable appearing.; Head:  Normocephalic, atraumatic; Eyes: EOMI, PERRL, No scleral icterus; ENMT: Mouth and pharynx normal, Mucous membranes moist; Neck: Supple, Full range of motion, No lymphadenopathy; Cardiovascular: Regular rate and rhythm, No gallop; Respiratory: Breath sounds clear & equal bilaterally, No wheezes.  Speaking full sentences with ease, Normal respiratory effort/excursion; Chest: Nontender, Movement normal; Abdomen: Soft, Nontender, Nondistended, Normal bowel sounds; Genitourinary: No CVA tenderness; Extremities: Pulses normal, +approximately 18x20cm erythematous indurated area to right upper inner thigh just distal to inguinal area with 2 shallow ulcers, larger of the 2  with skin necrosis, draining, foul odor, localized tenderness. No perineal rash, no buttocks rash, no streaking, no soft tissue crepitus..; Neuro: AA&Ox3, Major CN grossly intact.  Speech clear. No gross focal motor or sensory deficits in extremities.; Skin: Color normal, Warm, Dry.   ED Treatments / Results  Labs (all labs ordered are listed, but only abnormal results are displayed)   EKG  EKG Interpretation None       Radiology   Procedures Procedures (including critical care time)  Medications Ordered in ED Medications  morphine 4 MG/ML injection 4 mg (4 mg Intravenous Given 11/01/16 1948)  ondansetron (ZOFRAN) injection 4 mg (4 mg Intravenous Given 11/01/16 1948)  iopamidol (ISOVUE-300) 61 % injection 100 mL (not administered)     Initial Impression / Assessment and Plan / ED Course  I have reviewed the triage vital signs and the nursing notes.  Pertinent labs & imaging results that were available during my care of the patient were reviewed by me and considered in my medical decision making (see chart for details).  MDM Reviewed: previous chart, nursing note and vitals Reviewed previous:  labs Interpretation: labs and CT scan Total time providing critical care: 30-74 minutes. This excludes time spent performing separately reportable procedures and services. Consults: admitting MD   CRITICAL CARE Performed by: Laray Anger Total critical care time: 35 minutes Critical care time was exclusive of separately billable procedures and treating other patients. Critical care was necessary to treat or prevent imminent or life-threatening deterioration. Critical care was time spent personally by me on the following activities: development of treatment plan with patient and/or surrogate as well as nursing, discussions with consultants, evaluation of patient's response to treatment, examination of patient, obtaining history from patient or surrogate, ordering and performing  treatments and interventions, ordering and review of laboratory studies, ordering and review of radiographic studies, pulse oximetry and re-evaluation of patient's condition.   Results for orders placed or performed during the hospital encounter of 11/01/16  Culture, blood (routine x 2)  Result Value Ref Range   Specimen Description BLOOD RIGHT HAND    Special Requests BOTTLES DRAWN AEROBIC AND ANAEROBIC 5CC EACH    Culture PENDING    Report Status PENDING   Culture, blood (routine x 2)  Result Value Ref Range   Specimen Description BLOOD LEFT HAND    Special Requests BOTTLES DRAWN AEROBIC AND ANAEROBIC 6CC EACH    Culture PENDING    Report Status PENDING   CBC with Differential  Result Value Ref Range   WBC 9.6 4.0 - 10.5 K/uL   RBC 3.55 (L) 3.87 - 5.11 MIL/uL   Hemoglobin 10.2 (L) 12.0 - 15.0 g/dL   HCT 16.1 (L) 09.6 - 04.5 %   MCV 87.6 78.0 - 100.0 fL   MCH 28.7 26.0 - 34.0 pg   MCHC 32.8 30.0 - 36.0 g/dL   RDW 40.9 81.1 - 91.4 %   Platelets 275 150 - 400 K/uL   Neutrophils Relative % 80 %   Neutro Abs 7.7 1.7 - 7.7 K/uL   Lymphocytes Relative 11 %   Lymphs Abs 1.1 0.7 - 4.0 K/uL   Monocytes Relative 9 %   Monocytes Absolute 0.8 0.1 - 1.0 K/uL   Eosinophils Relative 0 %   Eosinophils Absolute 0.0 0.0 - 0.7 K/uL   Basophils Relative 0 %   Basophils Absolute 0.0 0.0 - 0.1 K/uL  Basic metabolic panel  Result Value Ref Range   Sodium 129 (L) 135 - 145 mmol/L   Potassium 3.9 3.5 - 5.1 mmol/L   Chloride 93 (L) 101 - 111 mmol/L   CO2 25 22 - 32 mmol/L   Glucose, Bld 182 (H) 65 - 99 mg/dL   BUN 11 6 - 20 mg/dL   Creatinine, Ser 7.82 0.44 - 1.00 mg/dL   Calcium 9.4 8.9 - 95.6 mg/dL   GFR calc non Af Amer >60 >60 mL/min   GFR calc Af Amer >60 >60 mL/min   Anion gap 11 5 - 15  Lactic acid, plasma  Result Value Ref Range   Lactic Acid, Venous 0.8 0.5 - 1.9 mmol/L    Results for LAMYIA, CDEBACA (MRN [id number]) as of 11/01/2016 21:15  Ref. Range 12/27/2008 13:15 09/14/2010  08:24 02/09/2016 06:48 11/01/2016 19:26  Sodium Latest Ref Range: 135 - 145 mmol/L 135 134 (L) 134 (L) 129 (L)    Ct Femur Right W Contrast Result Date: 11/01/2016 CLINICAL DATA:  Right thigh abscess along the inner thigh.  Fever. EXAM: CT OF THE LOWER RIGHT EXTREMITY WITH CONTRAST TECHNIQUE: Multidetector CT imaging of the lower right extremity was performed  according to the standard protocol following intravenous contrast administration. COMPARISON:  None. CONTRAST:  100mL ISOVUE-300 IOPAMIDOL (ISOVUE-300) INJECTION 61% FINDINGS: Bones/Joint/Cartilage No fracture nor bone destruction of the right hip and visualized right hemipelvis. Slight joint space narrowing of the right hip. No intra-articular loose bodies. Gluteal calcific tendinopathy. The right SI joint is maintained. The iliac bone and right pubic rami are intact. Ligaments Suboptimally assessed by CT. Muscles and Tendons No intramuscular abscess, mass or hemorrhage. Soft tissues There is a 4.2 x 5.4 x 1.9 cm (cc by AP by transverse) abscess with air-fluid noted within, it's upper margin at the right inguinal crease. IMPRESSION: 4.5 x 5.4 x 1.9 cm upper inner right thigh abscess with pockets of air and fluid noted within. No bony involvement. Electronically Signed   By: Tollie Ethavid  Kwon M.D.   On: 11/01/2016 22:07    2220:   New hyponatremia on labs. Will start IV clindamycin. Dx and testing d/w pt and family.  Questions answered.  Verb understanding, agreeable to admit.  T/C to General Surgery Dr. Lovell SheehanJenkins, case discussed, including:  HPI, pertinent PM/SHx, VS/PE, dx testing, ED course and treatment:  Agreeable to consult tomorrow. T/C to [facility] Dr. Robb Matarrtiz, case discussed, including:  HPI, pertinent PM/SHx, VS/PE, dx testing, ED course and treatment:  Agreeable to admit.     Final Clinical Impressions(s) / ED Diagnoses   Final diagnoses:  Cellulitis and abscess of right leg    New Prescriptions New Prescriptions   No medications on file       Kaitlyn JesterKathleen Declyn Delsol, DO 11/04/16 1533

## 2016-11-01 NOTE — ED Triage Notes (Signed)
Pt report a raised reddened abscess to her upper inner thigh on R- with fever, also with N Dr Dimas AguasHoward is her PCP and called in sulfa for her abscess, now pt reports increased size and pain as well as N

## 2016-11-01 NOTE — H&P (Signed)
History and Physical    Kaitlyn Dougherty WUJ:[id number] DOB: Jul 27, 1961 DOA: 11/01/2016  PCP: Selinda Flavin, MD   Patient coming from: Home.  Chief Complaint: Abscess of right upper thigh.  HPI: Kaitlyn Dougherty is a 56 y.o. female with medical history significant of type 2 diabetes, diabetic peripheral neuropathy, hyperlipidemia, hypertension, peripheral vascular disease who is coming to the emergency department due to a week history of progressively worse erythema, medial upper right thigh abscess, fever, chills, fatigue which has not responded so far to 3 doses of oral Bactrim that was called in by her PCP, Dr. Dimas Aguas, that she started yesterday evening.   Per patient, about a week ago she had a pimple-like lesions in her medial right upper thigh area. She denies any history of trauma and denies any manipulation of this lesion from her part as well. Subsequently, the area became erythematous, but has expanded considerably over the last 2 days. She complains of nausea and an episode of emesis earlier today. She denies renal ER, sore throat, productive cough, chest pain, dyspnea, pitting edema lower extremities, abdominal pain, diarrhea, constipation, dysuria, hematuria or frequency.  ED Course: The patient received IV clindamycin, morphine, Zofran and Toradol in the emergency department reporting partial relief of her symptoms. Her WBC 9.6, hemoglobin 10.2 g/dL, platelets 956. Her lactic acid was 0.8, sodium 129, potassium 3.9, chloride 93, bicarbonate 25 mmol/L. BUN was 11, creatinine 0.75 and glucose 182 mg/dL.  Imaging: CT of the right femur with contrast showed 4.5 x 5.4 x 1.9 cm in her tight abscess with pockets of air and fluid noted within. There was no oseous involvement.  Review of Systems: As per HPI otherwise 10 point review of systems negative.    Past Medical History:  Diagnosis Date  . Diabetes mellitus without complication (HCC)   . Diabetic peripheral neuropathy (HCC)   .  Hyperlipidemia   . Hypertension   . PVD (peripheral vascular disease) (HCC)   . Ulcer of abdomen wall [facility])     Past Surgical History:  Procedure Laterality Date  . ILIAC ARTERY STENT Bilateral 2006  . PERIPHERAL VASCULAR CATHETERIZATION N/A 02/09/2016   Procedure: Abdominal Aortogram w/Lower Extremity;  Surgeon: Sherren Kerns, MD;  Location: [facility] INVASIVE CV LAB;  Service: Cardiovascular;  Laterality: N/A;  . TONSILLECTOMY    . TUBAL LIGATION       reports that she has quit smoking. She smoked 0.00 packs per day. She has never used smokeless tobacco. She reports that she does not drink alcohol or use drugs.  Allergies  Allergen Reactions  . Adhesive [Tape] Itching and Rash    Please use "paper" tape    Family History  Problem Relation Age of Onset  . Hypertension Father     Prior to Admission medications   Medication Sig Start Date End Date Taking? Authorizing Provider  amLODipine (NORVASC) 10 MG tablet Take 10 mg by mouth daily. 10/20/16  Yes Historical Provider, MD  aspirin 81 MG EC tablet Take 81 mg by mouth daily. 10/01/16  Yes Historical Provider, MD  atorvastatin (LIPITOR) 20 MG tablet Take 20 mg by mouth daily.   Yes Historical Provider, MD  glipiZIDE (GLUCOTROL XL) 10 MG 24 hr tablet Take 10 mg by mouth every morning. 11/07/15  Yes Historical Provider, MD  hydrochlorothiazide (MICROZIDE) 12.5 MG capsule Take 12.5 mg by mouth daily. 09/17/16  Yes Historical Provider, MD  KLOR-CON M10 10 MEQ tablet Take 10 mEq by mouth daily. 09/20/16  Yes Historical  Provider, MD  lisinopril (PRINIVIL,ZESTRIL) 20 MG tablet Take 20 mg by mouth daily.    Yes Historical Provider, MD  meloxicam (MOBIC) 15 MG tablet Take 15 mg by mouth daily. 12/04/15  Yes Historical Provider, MD  metFORMIN (GLUCOPHAGE-XR) 500 MG 24 hr tablet Take 1,000 mg by mouth 2 (two) times daily.  10/26/15  Yes Historical Provider, MD  oxyCODONE-acetaminophen (PERCOCET/ROXICET) 5-325 MG tablet TAKE 1 TABLET BY MOUTH TWO TIMES  DAILY AS NEEDED FOR PAIN 10/04/16  Yes Historical Provider, MD  pantoprazole (PROTONIX) 20 MG tablet Take 20 mg by mouth daily.  12/27/15  Yes Historical Provider, MD  PROAIR HFA 108 (90 Base) MCG/ACT inhaler Inhale 2 puffs into the lungs every 6 (six) hours as needed for wheezing or shortness of breath.  11/06/15  Yes Historical Provider, MD  sulfamethoxazole-trimethoprim (BACTRIM DS,SEPTRA DS) 800-160 MG tablet Take 1 tablet by mouth 2 (two) times daily.   Yes Historical Provider, MD  traMADol (ULTRAM) 50 MG tablet Take 100 mg by mouth 3 (three) times daily. 10/03/16  Yes Historical Provider, MD  mometasone (ELOCON) 0.1 % lotion Place 2 drops into both ears 2 (two) times daily as needed (for ears).  11/15/15   Historical Provider, MD    Physical Exam:  Constitutional: NAD, calm, comfortable Vitals:   11/01/16 2215 11/01/16 2230 11/01/16 2245 11/01/16 2300  BP:    111/55  Pulse: 102 104 103 100  Resp:      Temp:      TempSrc:      SpO2: 95% 94% 95% 95%  Weight:      Height:       Eyes: PERRL, lids and conjunctivae normal ENMT: Mucous membranes are moist. Posterior pharynx clear of any exudate or lesions.  Neck: normal, supple, no masses, no thyromegaly Respiratory: clear to auscultation bilaterally, no wheezing, no crackles. Normal respiratory effort. No accessory muscle use.  Cardiovascular: Tachycardic at 104 BPM, no murmurs / rubs / gallops. No extremity edema. 2+ pedal pulses. No carotid bruits.  Abdomen: Soft, no tenderness, no masses palpated. No hepatosplenomegaly. Bowel sounds positive.  Musculoskeletal: no clubbing / cyanosis. Decreased ROM of right lower extremity, no contractures. Normal muscle tone.  Skin: Positive ecchymosed tender area of induration with calor, edema and surrounding erythema. Neurologic: CN 2-12 grossly intact. Sensation intact, DTR normal. Strength 5/5 in all 4.  Psychiatric: Normal judgment and insight. Alert and oriented x 3. Normal mood.    Labs on  Admission: I have personally reviewed following labs and imaging studies  CBC:  Recent Labs Lab 11/01/16 1926  WBC 9.6  NEUTROABS 7.7  HGB 10.2*  HCT 31.1*  MCV 87.6  PLT 275   Basic Metabolic Panel:  Recent Labs Lab 11/01/16 1926  NA 129*  K 3.9  CL 93*  CO2 25  GLUCOSE 182*  BUN 11  CREATININE 0.75  CALCIUM 9.4   GFR: Estimated Creatinine Clearance: 90.2 mL/min (by C-G formula based on SCr of 0.75 mg/dL). Liver Function Tests: No results for input(s): AST, ALT, ALKPHOS, BILITOT, PROT, ALBUMIN in the last 168 hours. No results for input(s): LIPASE, AMYLASE in the last 168 hours. No results for input(s): AMMONIA in the last 168 hours. Coagulation Profile: No results for input(s): INR, PROTIME in the last 168 hours. Cardiac Enzymes: No results for input(s): CKTOTAL, CKMB, CKMBINDEX, TROPONINI in the last 168 hours. BNP (last 3 results) No results for input(s): PROBNP in the last 8760 hours. HbA1C: No results for input(s): HGBA1C in the  last 72 hours. CBG: No results for input(s): GLUCAP in the last 168 hours. Lipid Profile: No results for input(s): CHOL, HDL, LDLCALC, TRIG, CHOLHDL, LDLDIRECT in the last 72 hours. Thyroid Function Tests: No results for input(s): TSH, T4TOTAL, FREET4, T3FREE, THYROIDAB in the last 72 hours. Anemia Panel: No results for input(s): VITAMINB12, FOLATE, FERRITIN, TIBC, IRON, RETICCTPCT in the last 72 hours. Urine analysis:    Component Value Date/Time   COLORURINE YELLOW 12/27/2008 1347   APPEARANCEUR CLEAR 12/27/2008 1347   LABSPEC 1.025 12/27/2008 1347   PHURINE 5.5 12/27/2008 1347   GLUCOSEU NEGATIVE 12/27/2008 1347   HGBUR LARGE (A) 12/27/2008 1347   BILIRUBINUR NEGATIVE 12/27/2008 1347   KETONESUR NEGATIVE 12/27/2008 1347   PROTEINUR NEGATIVE 12/27/2008 1347   UROBILINOGEN 0.2 12/27/2008 1347   NITRITE NEGATIVE 12/27/2008 1347   LEUKOCYTESUR NEGATIVE 12/27/2008 1347    Recent Results (from the past 240 hour(s))    Culture, blood (routine x 2)     Status: None (Preliminary result)   Collection Time: 11/01/16  7:57 PM  Result Value Ref Range Status   Specimen Description BLOOD RIGHT HAND  Final   Special Requests BOTTLES DRAWN AEROBIC AND ANAEROBIC 5CC EACH  Final   Culture PENDING  Incomplete   Report Status PENDING  Incomplete  Culture, blood (routine x 2)     Status: None (Preliminary result)   Collection Time: 11/01/16  8:00 PM  Result Value Ref Range Status   Specimen Description BLOOD LEFT HAND  Final   Special Requests BOTTLES DRAWN AEROBIC AND ANAEROBIC [facility] EACH  Final   Culture PENDING  Incomplete   Report Status PENDING  Incomplete     Radiological Exams on Admission: Ct Femur Right W Contrast  Result Date: 11/01/2016 CLINICAL DATA:  Right thigh abscess along the inner thigh.  Fever. EXAM: CT OF THE LOWER RIGHT EXTREMITY WITH CONTRAST TECHNIQUE: Multidetector CT imaging of the lower right extremity was performed according to the standard protocol following intravenous contrast administration. COMPARISON:  None. CONTRAST:  ISOVUE-300 IOPAMIDOL (ISOVUE-300) INJECTION 61% FINDINGS: Bones/Joint/Cartilage No fracture nor bone destruction of the right hip and visualized right hemipelvis. Slight joint space narrowing of the right hip. No intra-articular loose bodies. Gluteal calcific tendinopathy. The right SI joint is maintained. The iliac bone and right pubic rami are intact. Ligaments Suboptimally assessed by CT. Muscles and Tendons No intramuscular abscess, mass or hemorrhage. Soft tissues There is a 4.2 x 5.4 x 1.9 cm (cc by AP by transverse) abscess with air-fluid noted within, it's upper margin at the right inguinal crease. IMPRESSION: 4.5 x 5.4 x 1.9 cm upper inner right thigh abscess with pockets of air and fluid noted within. No bony involvement. Electronically Signed   By: Tollie Eth M.D.   On: 11/01/2016 22:07     Assessment/Plan Principal Problem:   Cellulitis and abscess of  right lower extremity. Admit to MedSurg/inpatient. Continue IV fluids. Continue clindamycin 900 mg IVPB every 8 hours. Percocet 5/325 one tablet every 4 hours as needed. Toradol 30 mg IVPB every 6 hours as needed. Surgical evaluation in a.m.  Active Problems:   Type 2 diabetes mellitus (HCC) Carbohydrate modified diet. Continue glipizide XL 10 mg by mouth daily. Continue metformin XR 1000 mg by mouth twice a day. CBG monitoring with regular insulin sliding scale.    Hyperlipidemia Continue Atorvastatin 20 mg by mouth daily. Monitor LFTs. Fasting lipids follow-up as an outpatient.    Essential hypertension Continue amlodipine 10 mg by mouth daily.  Monitor blood pressure.    Hyponatremia Likely secondary to emesis and decreased oral intake. Continue normal saline infusion and follow-up sodium level.    Anemia Check anemia profile. Monitor hematocrit and hemoglobin.    DVT prophylaxis: Lovenox SQ. Code Status: Full code. Family Communication:  Disposition Plan: Admit for IV antibiotic therapy and general surgery evaluation. Consults called: Dr. Lovell SheehanJenkins was contacted by the emergency department. Admission status: Inpatient/MedSurg.   Bobette Moavid Manuel Ortiz MD [facility] Pager [phone].  If 7PM-7AM, please contact night-coverage [email or web address] Password [email or web address]  11/01/2016, 11:30 PM

## 2016-11-02 ENCOUNTER — Encounter (HOSPITAL_COMMUNITY): Payer: Self-pay

## 2016-11-02 LAB — CBC WITH DIFFERENTIAL/PLATELET
BASOS ABS: 0 10*3/uL (ref 0.0–0.1)
Basophils Relative: 0 %
EOS ABS: 0 10*3/uL (ref 0.0–0.7)
Eosinophils Relative: 0 %
HCT: 29.4 % — ABNORMAL LOW (ref 36.0–46.0)
HEMOGLOBIN: 9.6 g/dL — AB (ref 12.0–15.0)
LYMPHS ABS: 0.9 10*3/uL (ref 0.7–4.0)
Lymphocytes Relative: 10 %
MCH: 29 pg (ref 26.0–34.0)
MCHC: 32.7 g/dL (ref 30.0–36.0)
MCV: 88.8 fL (ref 78.0–100.0)
Monocytes Absolute: 0.9 10*3/uL (ref 0.1–1.0)
Monocytes Relative: 10 %
NEUTROS PCT: 80 %
Neutro Abs: 7.4 10*3/uL (ref 1.7–7.7)
Platelets: 269 10*3/uL (ref 150–400)
RBC: 3.31 MIL/uL — ABNORMAL LOW (ref 3.87–5.11)
RDW: 14.8 % (ref 11.5–15.5)
WBC: 9.2 10*3/uL (ref 4.0–10.5)

## 2016-11-02 LAB — COMPREHENSIVE METABOLIC PANEL
ALBUMIN: 2.7 g/dL — AB (ref 3.5–5.0)
ALK PHOS: 123 U/L (ref 38–126)
ALT: 34 U/L (ref 14–54)
AST: 27 U/L (ref 15–41)
Anion gap: 12 (ref 5–15)
BUN: 12 mg/dL (ref 6–20)
CALCIUM: 8.5 mg/dL — AB (ref 8.9–10.3)
CHLORIDE: 98 mmol/L — AB (ref 101–111)
CO2: 21 mmol/L — AB (ref 22–32)
CREATININE: 0.7 mg/dL (ref 0.44–1.00)
GFR calc Af Amer: >60 mL/min (ref >60)
GFR calc non Af Amer: >60 mL/min (ref >60)
GLUCOSE: 150 mg/dL — AB (ref 65–99)
Potassium: 3.6 mmol/L (ref 3.5–5.1)
SODIUM: 131 mmol/L — AB (ref 135–145)
Total Bilirubin: 0.8 mg/dL (ref 0.3–1.2)
Total Protein: 6.5 g/dL (ref 6.5–8.1)

## 2016-11-02 LAB — GLUCOSE, CAPILLARY
GLUCOSE-CAPILLARY: 165 mg/dL — AB (ref 65–99)
Glucose-Capillary: 151 mg/dL — ABNORMAL HIGH (ref 65–99)
Glucose-Capillary: 67 mg/dL (ref 65–99)
Glucose-Capillary: 70 mg/dL (ref 65–99)

## 2016-11-02 LAB — IRON AND TIBC
IRON: 10 ug/dL — AB (ref 28–170)
SATURATION RATIOS: 4 % — AB (ref 10.4–31.8)
TIBC: 232 ug/dL — ABNORMAL LOW (ref 250–450)
UIBC: 222 ug/dL

## 2016-11-02 LAB — FERRITIN: FERRITIN: 175 ng/mL (ref 11–307)

## 2016-11-02 LAB — VITAMIN B12: Vitamin B-12: 135 pg/mL — ABNORMAL LOW (ref 180–914)

## 2016-11-02 LAB — RETICULOCYTES
RBC.: 3.31 MIL/uL — AB (ref 3.87–5.11)
RETIC COUNT ABSOLUTE: 26.5 10*3/uL (ref 19.0–186.0)
Retic Ct Pct: 0.8 % (ref 0.4–3.1)

## 2016-11-02 LAB — MRSA PCR SCREENING: MRSA by PCR: NEGATIVE

## 2016-11-02 LAB — FOLATE: Folate: 9.1 ng/mL (ref >5.9)

## 2016-11-02 MED ORDER — ENSURE ENLIVE PO LIQD: 237.0000 mL | Freq: Two times a day (BID) | ORAL | Status: DC

## 2016-11-02 MED ORDER — HEPARIN SODIUM (PORCINE) 5000 UNIT/ML IJ SOLN
5000.0000 [IU] | Freq: Three times a day (TID) | INTRAMUSCULAR | Status: DC
  Administered 2016-11-02 – 2016-11-07 (×16): 5000 [IU] via SUBCUTANEOUS
  Filled 2016-11-02 (×17): qty 1

## 2016-11-02 MED ORDER — ATORVASTATIN CALCIUM 20 MG PO TABS
20.0000 mg | ORAL_TABLET | Freq: Every day | ORAL | Status: DC
  Administered 2016-11-02 – 2016-11-07 (×6): 20 mg via ORAL
  Filled 2016-11-02 (×6): qty 1

## 2016-11-02 MED ORDER — OXYCODONE-ACETAMINOPHEN 5-325 MG PO TABS
1.0000 | ORAL_TABLET | ORAL | Status: DC | PRN
  Administered 2016-11-02 – 2016-11-05 (×15): 1 via ORAL
  Filled 2016-11-02 (×15): qty 1

## 2016-11-02 MED ORDER — PRO-STAT SUGAR FREE PO LIQD
30.0000 mL | Freq: Two times a day (BID) | ORAL | Status: DC
  Administered 2016-11-02 – 2016-11-07 (×10): 30 mL via ORAL
  Filled 2016-11-02 (×9): qty 30

## 2016-11-02 MED ORDER — GLIPIZIDE ER 5 MG PO TB24
10.0000 mg | ORAL_TABLET | Freq: Every morning | ORAL | Status: DC
  Administered 2016-11-02: 10 mg via ORAL
  Filled 2016-11-02: qty 2

## 2016-11-02 MED ORDER — METFORMIN HCL ER 500 MG PO TB24
1000.0000 mg | ORAL_TABLET | Freq: Two times a day (BID) | ORAL | Status: DC
  Administered 2016-11-02: 1000 mg via ORAL
  Filled 2016-11-02: qty 2

## 2016-11-02 MED ORDER — MUPIROCIN CALCIUM 2 % EX CREA
TOPICAL_CREAM | Freq: Two times a day (BID) | CUTANEOUS | Status: DC
  Administered 2016-11-02 – 2016-11-03 (×3): via TOPICAL
  Filled 2016-11-02: qty 15

## 2016-11-02 MED ORDER — POTASSIUM CHLORIDE CRYS ER 10 MEQ PO TBCR
10.0000 meq | EXTENDED_RELEASE_TABLET | Freq: Every day | ORAL | Status: DC
  Administered 2016-11-02 – 2016-11-03 (×2): 10 meq via ORAL
  Filled 2016-11-02 (×2): qty 1

## 2016-11-02 MED ORDER — SODIUM CHLORIDE 0.9 % IV SOLN
INTRAVENOUS | Status: AC
  Administered 2016-11-02 (×2): via INTRAVENOUS

## 2016-11-02 MED ORDER — CLINDAMYCIN PHOSPHATE 900 MG/50ML IV SOLN
900.0000 mg | Freq: Three times a day (TID) | INTRAVENOUS | Status: DC
  Administered 2016-11-02 – 2016-11-04 (×7): 900 mg via INTRAVENOUS
  Filled 2016-11-02 (×11): qty 50

## 2016-11-02 MED ORDER — KETOROLAC TROMETHAMINE 30 MG/ML IJ SOLN
30.0000 mg | Freq: Four times a day (QID) | INTRAMUSCULAR | Status: AC | PRN
  Administered 2016-11-03 – 2016-11-06 (×4): 30 mg via INTRAVENOUS
  Filled 2016-11-02 (×4): qty 1

## 2016-11-02 MED ORDER — AMLODIPINE BESYLATE 5 MG PO TABS: 10.0000 mg | ORAL_TABLET | Freq: Every day | ORAL | Status: DC

## 2016-11-02 MED ORDER — PANTOPRAZOLE SODIUM 40 MG PO TBEC
40.0000 mg | DELAYED_RELEASE_TABLET | Freq: Every day | ORAL | Status: DC
  Administered 2016-11-02 – 2016-11-07 (×6): 40 mg via ORAL
  Filled 2016-11-02 (×6): qty 1

## 2016-11-02 MED ORDER — LISINOPRIL 10 MG PO TABS
20.0000 mg | ORAL_TABLET | Freq: Every day | ORAL | Status: DC
  Administered 2016-11-02 – 2016-11-05 (×2): 20 mg via ORAL
  Filled 2016-11-02 (×2): qty 2
  Filled 2016-11-02: qty 4
  Filled 2016-11-02 (×2): qty 2

## 2016-11-02 MED ORDER — ONDANSETRON HCL 4 MG/2ML IJ SOLN
4.0000 mg | Freq: Four times a day (QID) | INTRAMUSCULAR | Status: DC | PRN
  Filled 2016-11-02: qty 2

## 2016-11-02 MED ORDER — ASPIRIN EC 81 MG PO TBEC
81.0000 mg | DELAYED_RELEASE_TABLET | Freq: Every day | ORAL | Status: DC
  Administered 2016-11-02 – 2016-11-07 (×6): 81 mg via ORAL
  Filled 2016-11-02 (×6): qty 1

## 2016-11-02 MED ORDER — SENNOSIDES-DOCUSATE SODIUM 8.6-50 MG PO TABS
1.0000 | ORAL_TABLET | Freq: Every day | ORAL | Status: DC
  Administered 2016-11-02 – 2016-11-06 (×5): 1 via ORAL
  Filled 2016-11-02 (×5): qty 1

## 2016-11-02 MED ORDER — CLINDAMYCIN PHOSPHATE 900 MG/50ML IV SOLN
INTRAVENOUS | Status: AC
  Filled 2016-11-02: qty 50

## 2016-11-02 MED ORDER — GABAPENTIN 400 MG PO CAPS
400.0000 mg | ORAL_CAPSULE | Freq: Every evening | ORAL | Status: DC
  Administered 2016-11-02 – 2016-11-06 (×5): 400 mg via ORAL
  Filled 2016-11-02 (×6): qty 1

## 2016-11-02 MED ORDER — INSULIN ASPART 100 UNIT/ML ~~LOC~~ SOLN
0.0000 [IU] | Freq: Three times a day (TID) | SUBCUTANEOUS | Status: DC
  Administered 2016-11-02 (×2): 3 [IU] via SUBCUTANEOUS

## 2016-11-02 NOTE — Progress Notes (Signed)
Initial Nutrition Assessment  DOCUMENTATION CODES:  Obesity unspecified  INTERVENTION:  D/c ensure  Will order 30 mL Prostat BID, each supplement provides 100 kcal and 15 grams of protein.  NUTRITION DIAGNOSIS:  Increased nutrient needs related to wound healing, acute illness as evidenced by estimated nutritional requirements for this condition  GOAL:  Patient will meet greater than or equal to 90% of their needs  MONITOR:  PO intake, Supplement acceptance, Labs  REASON FOR ASSESSMENT:  Malnutrition Screening Tool    ASSESSMENT:  56 y/o female PMHx DM2, HLD, HTN, PVD. Presented with progressively worsening erythema, medial R upper thigh abscess, fever, chills fatigue. Has not responded to 3 doses bactrim. Worked up for cellulitis with abscess. Admitted for IV abx and surgery consult.  RD operating remotely, per chart review, pt has had some nausea and vomiting associated with her infection. No direct mention of poor PO intake or wt loss associated with this acute episode.   Pt appears to have had a longer hospital admission this past November (11/7) related to pelvic abscess. From RD notes from that admission, she had AMS 3-4 weeks prior to that admission and had "no intake:. She was admitted at a weight of 243 lbs. At an outpatient follow up a few weeks later revealed new base weight of 217 lbs. She has lost another 16 lbs since that time. This is just shy of being a clinically significant loss for the timeframe.   Given obese status w/o report of poor PO intake,e will add supplemental protein w/o excess calories  NFPE: unable to conduct  Meds: Ensure, glip[izide, IV abx, ppi, kcl,  Labs: Albumin: 2.7, Glu 150-180, a1c on 11/7 was 7.7. New one pending   Recent Labs Lab 11/01/16 1926 11/02/16 0607  NA 129* 131*  K 3.9 3.6  CL 93* 98*  CO2 25 21*  BUN 11 12  CREATININE 0.75 0.70  CALCIUM 9.4 8.5*  GLUCOSE 182* 150*   Diet Order:  Diet Carb Modified Fluid consistency:  Thin; Room service appropriate? Yes Diet NPO time specified Except for: [employer] Chips, Sips with Meds  Skin: Cellulitis Right upper thigh  Last BM:  2/15  Height:  Ht Readings from Last 1 Encounters:  11/01/16 5\' 6"  (1.676 m)   Weight:  Wt Readings from Last 1 Encounters:  11/02/16 201 lb 8 oz (91.4 kg)   Wt Readings from Last 10 Encounters:  11/02/16 201 lb 8 oz (91.4 kg)  02/09/16 260 lb (117.9 kg)  01/18/16 255 lb 8 oz (115.9 kg)   Ideal Body Weight:  59.1 kg  BMI:  Body mass index is 32.52 kg/m.  Estimated Nutritional Needs:  Kcal:  1650-1850 kcals (18-20 kcal/kg bw) Protein:  77-87 g (1.3-1.5 g/kg bw) Fluid:  1.7-1.9 l FLUID  EDUCATION NEEDS:  No education needs identified at this time  Christophe LouisNathan Tyrin Herbers RD, LDN, CNSC Clinical Nutrition Pager: [phone] 11/02/2016 9:04 AM

## 2016-11-02 NOTE — Progress Notes (Signed)
PROGRESS NOTE  Kaitlyn Dougherty ZOX:[id number] DOB: [birth year] DOA: 11/01/2016 PCP: Selinda Flavin, MD  HPI/Recap of past 24 hours:  Right thigh pain  Assessment/Plan: Principal Problem:   Cellulitis and abscess of right lower extremity Active Problems:   Type 2 diabetes mellitus (HCC)   Hyperlipidemia   Essential hypertension   Hyponatremia   Anemia  Right thigh cellulitis/abscess CT right thigh" 4.5 x 5.4 x 1.9 cm upper inner right thigh abscess with pockets of air and fluid noted within. No bony involvement." Wound is draining, wound culture pending collection, blood culture in process, mrsa screening negative, no fever, no leukocytosis, bp low normal, lactic acid wnl, slight sinus tachycardia has improved On cleocin, local wound care, general surgery consulted, input appreciated.  Hyponatremia:  Na129, improving on ivf  Normocytic anemia:  No sign of active bleed, platelet wnl, INR pending, anemia work up pending, no need of prbc transfusion. Monitor hgb  noninsulin dependent dm2 Check a1c, on ssi here  hold metformin while in the hospital, may resume at discharge  HTN; continue lisinopril , hold norvasc  HLD: continue statin  Body mass index is 32.52 kg/m.  Code Status: full  Family Communication: patient   Disposition Plan: home in a few days with general surgery clearance   Consultants:  General surgery  Procedures:  none  Antibiotics:  cleocin   Objective: BP (!) 114/55 (BP Location: Right Arm)   Pulse 81   Temp 98.4 F (36.9 C) (Oral)   Resp 18   Ht 5\' 6"  (1.676 m)   Wt 91.4 kg (201 lb 8 oz)   SpO2 95%   BMI 32.52 kg/m   Intake/Output Summary (Last 24 hours) at 11/02/16 0839 Last data filed at 11/02/16 0600  Gross per 24 hour  Intake          1243.33 ml  Output                0 ml  Net          1243.33 ml   Filed Weights   11/01/16 1859 11/02/16 0135  Weight: 90.7 kg (200 lb) 91.4 kg (201 lb 8 oz)    Exam:   General:   NAD  Cardiovascular: RRR  Respiratory: CTABL  Abdomen: Soft/ND/NT, positive BS  Musculoskeletal: superficial skin necrosis in the upper, inner thigh. A small amount of drainage is noted. There is erythema and induration in the surrounding region. The superficial skin necrosis measures approximately 3-4 cm in diameter  Neuro: aaox3  Data Reviewed: Basic Metabolic Panel:  Recent Labs Lab 11/01/16 1926 11/02/16 0607  NA 129* 131*  K 3.9 3.6  CL 93* 98*  CO2 25 21*  GLUCOSE 182* 150*  BUN 11 12  CREATININE 0.75 0.70  CALCIUM 9.4 8.5*   Liver Function Tests:  Recent Labs Lab 11/02/16 0607  AST 27  ALT 34  ALKPHOS 123  BILITOT 0.8  PROT 6.5  ALBUMIN 2.7*   No results for input(s): LIPASE, AMYLASE in the last 168 hours. No results for input(s): AMMONIA in the last 168 hours. CBC:  Recent Labs Lab 11/01/16 1926 11/02/16 0607  WBC 9.6 9.2  NEUTROABS 7.7 7.4  HGB 10.2* 9.6*  HCT 31.1* 29.4*  MCV 87.6 88.8  PLT 275 269   Cardiac Enzymes:   No results for input(s): CKTOTAL, CKMB, CKMBINDEX, TROPONINI in the last 168 hours. BNP (last 3 results) No results for input(s): BNP in the last 8760 hours.  ProBNP (last 3  results) No results for input(s): PROBNP in the last 8760 hours.  CBG:  Recent Labs Lab 11/02/16 0723  GLUCAP 151*    Recent Results (from the past 240 hour(s))  Culture, blood (routine x 2)     Status: None (Preliminary result)   Collection Time: 11/01/16  7:57 PM  Result Value Ref Range Status   Specimen Description BLOOD RIGHT HAND  Final   Special Requests BOTTLES DRAWN AEROBIC AND ANAEROBIC 5CC EACH  Final   Culture PENDING  Incomplete   Report Status PENDING  Incomplete  Culture, blood (routine x 2)     Status: None (Preliminary result)   Collection Time: 11/01/16  8:00 PM  Result Value Ref Range Status   Specimen Description BLOOD LEFT HAND  Final   Special Requests BOTTLES DRAWN AEROBIC AND ANAEROBIC [facility]6CC EACH  Final   Culture  PENDING  Incomplete   Report Status PENDING  Incomplete  MRSA PCR Screening     Status: None   Collection Time: 11/02/16  1:45 AM  Result Value Ref Range Status   MRSA by PCR NEGATIVE NEGATIVE Final    Comment:        The GeneXpert MRSA Assay (FDA approved for NASAL specimens only), is one component of a comprehensive MRSA colonization surveillance program. It is not intended to diagnose MRSA infection nor to guide or monitor treatment for MRSA infections.      Studies: Ct Femur Right W Contrast  Result Date: 11/01/2016 CLINICAL DATA:  Right thigh abscess along the inner thigh.  Fever. EXAM: CT OF THE LOWER RIGHT EXTREMITY WITH CONTRAST TECHNIQUE: Multidetector CT imaging of the lower right extremity was performed according to the standard protocol following intravenous contrast administration. COMPARISON:  None. CONTRAST:  100mL ISOVUE-300 IOPAMIDOL (ISOVUE-300) INJECTION 61% FINDINGS: Bones/Joint/Cartilage No fracture nor bone destruction of the right hip and visualized right hemipelvis. Slight joint space narrowing of the right hip. No intra-articular loose bodies. Gluteal calcific tendinopathy. The right SI joint is maintained. The iliac bone and right pubic rami are intact. Ligaments Suboptimally assessed by CT. Muscles and Tendons No intramuscular abscess, mass or hemorrhage. Soft tissues There is a 4.2 x 5.4 x 1.9 cm (cc by AP by transverse) abscess with air-fluid noted within, it's upper margin at the right inguinal crease. IMPRESSION: 4.5 x 5.4 x 1.9 cm upper inner right thigh abscess with pockets of air and fluid noted within. No bony involvement. Electronically Signed   By: Tollie Ethavid  Kwon M.D.   On: 11/01/2016 22:07    Scheduled Meds: . aspirin EC  81 mg Oral Daily  . atorvastatin  20 mg Oral Daily  . clindamycin (CLEOCIN) IV  900 mg Intravenous Q8H  . feeding supplement (ENSURE ENLIVE)  237 mL Oral BID BM  . glipiZIDE  10 mg Oral q morning - 10a  . heparin  5,000 Units  Subcutaneous Q8H  . insulin aspart  0-15 Units Subcutaneous TID WC  . lisinopril  20 mg Oral Daily  . pantoprazole  40 mg Oral Daily  . potassium chloride  10 mEq Oral Daily    Continuous Infusions: . sodium chloride 100 mL/hr at 11/02/16 0134     Time spent: 35mins  Edona Schreffler MD, PhD  [facility] Pager [phone]. If 7PM-7AM, please contact night-coverage at [email or web address], password [email or web address] 11/02/2016, 8:39 AM  LOS: 1 day

## 2016-11-02 NOTE — Consult Note (Signed)
Reason for Consult: Cellulitis with abscess, right thigh Referring Physician: Dr. [name] is an 56 y.o. female.  HPI: Patient is a 56 year old obese white female with diabetes mellitus who presented to the emergency room with a two-day history of worsening right upper inner thigh pain and swelling. She states that she has noticed a bad odor coming from this region. She denies any fever or chills. She has had boils in this area in the past. She presented to the emergency room and was found on CT scan of the right thigh to have cellulitis and fluid collection just underneath the skin. Patient was admitted to the hospital for further evaluation treatment. No leukocytosis was noted.  Past Medical History:  Diagnosis Date  . Diabetes mellitus without complication ([place])   . Diabetic peripheral neuropathy ([place])   . Hyperlipidemia   . Hypertension   . PVD (peripheral vascular disease) ([place])   . Ulcer of abdomen wall [facility])     Past Surgical History:  Procedure Laterality Date  . ILIAC ARTERY STENT Bilateral 2006  . PERIPHERAL VASCULAR CATHETERIZATION N/A 02/09/2016   Procedure: Abdominal Aortogram w/Lower Extremity;  Surgeon: Elam Dutch, MD;  Location: [place] [facility];  Service: Cardiovascular;  Laterality: N/A;  . TONSILLECTOMY    . TUBAL LIGATION      Family History  Problem Relation Age of Onset  . Hypertension Father     Social History:  reports that she has quit smoking. She smoked 0.00 packs per day. She has never used smokeless tobacco. She reports that she does not drink alcohol or use drugs.  Allergies:  Allergies  Allergen Reactions  . Adhesive [Tape] Itching and Rash    Please use "paper" tape    Medications:  Prior to Admission:  Prescriptions Prior to Admission  Medication Sig Dispense Refill Last Dose  . amLODipine (NORVASC) 10 MG tablet Take 10 mg by mouth daily.  1 Past Week at Unknown time  . aspirin 81 MG EC tablet Take 81 mg by mouth daily.  3  Past Week at Unknown time  . atorvastatin (LIPITOR) 20 MG tablet Take 20 mg by mouth daily.   Past Week at Unknown time  . glipiZIDE (GLUCOTROL XL) 10 MG 24 hr tablet Take 10 mg by mouth every morning.  3 Past Week at Unknown time  . hydrochlorothiazide (MICROZIDE) 12.5 MG capsule Take 12.5 mg by mouth daily.  3 Past Week at Unknown time  . KLOR-CON M10 10 MEQ tablet Take 10 mEq by mouth daily.  3 Past Week at Unknown time  . lisinopril (PRINIVIL,ZESTRIL) 20 MG tablet Take 20 mg by mouth daily.    Past Week at Unknown time  . meloxicam (MOBIC) 15 MG tablet Take 15 mg by mouth daily.  3 Past Week at Unknown time  . metFORMIN (GLUCOPHAGE-XR) 500 MG 24 hr tablet Take 1,000 mg by mouth 2 (two) times daily.   3 Past Week at Unknown time  . oxyCODONE-acetaminophen (PERCOCET/ROXICET) 5-325 MG tablet TAKE 1 TABLET BY MOUTH TWO TIMES DAILY AS NEEDED FOR PAIN  0 11/01/2016 at [facility]  . pantoprazole (PROTONIX) 20 MG tablet Take 20 mg by mouth daily.   3 Past Week at Unknown time  . PROAIR HFA 108 (90 Base) MCG/ACT inhaler Inhale 2 puffs into the lungs every 6 (six) hours as needed for wheezing or shortness of breath.   0 UNKNOWN  . sulfamethoxazole-trimethoprim (BACTRIM DS,SEPTRA DS) 800-160 MG tablet Take 1 tablet by  mouth 2 (two) times daily.   11/01/2016 at Unknown time  . traMADol (ULTRAM) 50 MG tablet Take 100 mg by mouth 3 (three) times daily.  1 Past Week at Unknown time  . mometasone (ELOCON) 0.1 % lotion Place 2 drops into both ears 2 (two) times daily as needed (for ears).   1 unknown   Scheduled: . amLODipine  10 mg Oral Daily  . aspirin EC  81 mg Oral Daily  . atorvastatin  20 mg Oral Daily  . clindamycin (CLEOCIN) IV  900 mg Intravenous Q8H  . feeding supplement (ENSURE ENLIVE)  237 mL Oral BID BM  . glipiZIDE  10 mg Oral q morning - 10a  . heparin  5,000 Units Subcutaneous Q8H  . insulin aspart  0-15 Units Subcutaneous TID WC  . lisinopril  20 mg Oral Daily  . metFORMIN  1,000 mg Oral  BID WC  . pantoprazole  40 mg Oral Daily  . potassium chloride  10 mEq Oral Daily    Results for orders placed or performed during the hospital encounter of 11/01/16 (from the past 48 hour(s))  CBC with Differential     Status: Abnormal   Collection Time: 11/01/16  7:26 PM  Result Value Ref Range   WBC 9.6 4.0 - 10.5 K/uL   RBC 3.55 (L) 3.87 - 5.11 MIL/uL   Hemoglobin 10.2 (L) 12.0 - 15.0 g/dL   HCT 31.1 (L) 36.0 - 46.0 %   MCV 87.6 78.0 - 100.0 fL   MCH 28.7 26.0 - 34.0 pg   MCHC 32.8 30.0 - 36.0 g/dL   RDW 14.7 11.5 - 15.5 %   Platelets 275 150 - 400 K/uL   Neutrophils Relative % 80 %   Neutro Abs 7.7 1.7 - 7.7 K/uL   Lymphocytes Relative 11 %   Lymphs Abs 1.1 0.7 - 4.0 K/uL   Monocytes Relative 9 %   Monocytes Absolute 0.8 0.1 - 1.0 K/uL   Eosinophils Relative 0 %   Eosinophils Absolute 0.0 0.0 - 0.7 K/uL   Basophils Relative 0 %   Basophils Absolute 0.0 0.0 - 0.1 K/uL  Basic metabolic panel     Status: Abnormal   Collection Time: 11/01/16  7:26 PM  Result Value Ref Range   Sodium 129 (L) 135 - 145 mmol/L   Potassium 3.9 3.5 - 5.1 mmol/L   Chloride 93 (L) 101 - 111 mmol/L   CO2 25 22 - 32 mmol/L   Glucose, Bld 182 (H) 65 - 99 mg/dL   BUN 11 6 - 20 mg/dL   Creatinine, Ser 0.75 0.44 - 1.00 mg/dL   Calcium 9.4 8.9 - 10.3 mg/dL   GFR calc non Af Amer >60 >60 mL/min   GFR calc Af Amer >60 >60 mL/min    Comment: (NOTE) The eGFR has been calculated using the CKD EPI equation. This calculation has not been validated in all clinical situations. eGFR's persistently <60 mL/min signify possible Chronic Kidney Disease.    Anion gap 11 5 - 15  Lactic acid, plasma     Status: None   Collection Time: 11/01/16  7:26 PM  Result Value Ref Range   Lactic Acid, Venous 0.8 0.5 - 1.9 mmol/L  Culture, blood (routine x 2)     Status: None (Preliminary result)   Collection Time: 11/01/16  7:57 PM  Result Value Ref Range   Specimen Description BLOOD RIGHT HAND    Special Requests  BOTTLES DRAWN AEROBIC AND ANAEROBIC 5CC EACH  Culture PENDING    Report Status PENDING   Culture, blood (routine x 2)     Status: None (Preliminary result)   Collection Time: 11/01/16  8:00 PM  Result Value Ref Range   Specimen Description BLOOD LEFT HAND    Special Requests BOTTLES DRAWN AEROBIC AND ANAEROBIC 6CC EACH    Culture PENDING    Report Status PENDING   Lactic acid, plasma     Status: None   Collection Time: 11/01/16 10:14 PM  Result Value Ref Range   Lactic Acid, Venous 0.7 0.5 - 1.9 mmol/L  MRSA PCR Screening     Status: None   Collection Time: 11/02/16  1:45 AM  Result Value Ref Range   MRSA by PCR NEGATIVE NEGATIVE    Comment:        The GeneXpert MRSA Assay (FDA approved for NASAL specimens only), is one component of a comprehensive MRSA colonization surveillance program. It is not intended to diagnose MRSA infection nor to guide or monitor treatment for MRSA infections.   CBC WITH DIFFERENTIAL     Status: Abnormal   Collection Time: 11/02/16  6:07 AM  Result Value Ref Range   WBC 9.2 4.0 - 10.5 K/uL   RBC 3.31 (L) 3.87 - 5.11 MIL/uL   Hemoglobin 9.6 (L) 12.0 - 15.0 g/dL   HCT 29.4 (L) 36.0 - 46.0 %   MCV 88.8 78.0 - 100.0 fL   MCH 29.0 26.0 - 34.0 pg   MCHC 32.7 30.0 - 36.0 g/dL   RDW 14.8 11.5 - 15.5 %   Platelets 269 150 - 400 K/uL   Neutrophils Relative % 80 %   Neutro Abs 7.4 1.7 - 7.7 K/uL   Lymphocytes Relative 10 %   Lymphs Abs 0.9 0.7 - 4.0 K/uL   Monocytes Relative 10 %   Monocytes Absolute 0.9 0.1 - 1.0 K/uL   Eosinophils Relative 0 %   Eosinophils Absolute 0.0 0.0 - 0.7 K/uL   Basophils Relative 0 %   Basophils Absolute 0.0 0.0 - 0.1 K/uL  Reticulocytes     Status: Abnormal   Collection Time: 11/02/16  6:07 AM  Result Value Ref Range   Retic Ct Pct 0.8 0.4 - 3.1 %   RBC. 3.31 (L) 3.87 - 5.11 MIL/uL   Retic Count, Manual 26.5 19.0 - 186.0 K/uL  Glucose, capillary     Status: Abnormal   Collection Time: 11/02/16  7:23 AM  Result  Value Ref Range   Glucose-Capillary 151 (H) 65 - 99 mg/dL   Comment 1 Notify RN    Comment 2 Document in Chart     Ct Femur Right W Contrast  Result Date: 11/01/2016 CLINICAL DATA:  Right thigh abscess along the inner thigh.  Fever. EXAM: CT OF THE LOWER RIGHT EXTREMITY WITH CONTRAST TECHNIQUE: Multidetector CT imaging of the lower right extremity was performed according to the standard protocol following intravenous contrast administration. COMPARISON:  None. CONTRAST:  115m ISOVUE-300 IOPAMIDOL (ISOVUE-300) INJECTION 61% FINDINGS: Bones/Joint/Cartilage No fracture nor bone destruction of the right hip and visualized right hemipelvis. Slight joint space narrowing of the right hip. No intra-articular loose bodies. Gluteal calcific tendinopathy. The right SI joint is maintained. The iliac bone and right pubic rami are intact. Ligaments Suboptimally assessed by CT. Muscles and Tendons No intramuscular abscess, mass or hemorrhage. Soft tissues There is a 4.2 x 5.4 x 1.9 cm (cc by AP by transverse) abscess with air-fluid noted within, it's upper margin at the right inguinal  crease. IMPRESSION: 4.5 x 5.4 x 1.9 cm upper inner right thigh abscess with pockets of air and fluid noted within. No bony involvement. Electronically Signed   By: Ashley Royalty M.D.   On: 11/01/2016 22:07    ROS:  Pertinent items noted in HPI and remainder of comprehensive ROS otherwise negative.  Blood pressure (!) 114/55, pulse 81, temperature 98.4 F (36.9 C), temperature source Oral, resp. rate 18, height _0  (1.676 m), weight 91.4 kg (201 lb 8 oz), SpO2 95 %. Physical Exam: Pleasant white female in no acute distress. Head is normocephalic, atraumatic. Neck is supple without lymphadenopathy. Lungs clear auscultation with equal breath sounds bilaterally. Heart examination reveals a regular rate and rhythm without S3, S4, murmurs. Extremity examination reveals superficial skin necrosis in the upper, inner thigh. A small  amount of drainage is noted. There is erythema and induration in the surrounding region. The superficial skin necrosis measures approximately 3-4 cm in diameter. CT scan images reviewed.  Assessment/Plan: Impression: Cellulitis with draining abscess and superficial skin necrosis, right upper inner thigh, diabetes mellitus. No need for acute surgical prevention at this time. Repeat white blood cell count is within normal limits. Plan: Local wound care has been initiated. Would continue antibiotics for now. We'll reevaluate in a.m. Should it be required, debridement of the skin may be performed in the operating room. I have started her on a carb modified diet.  Kaitlyn Dougherty A 11/02/2016, 7:44 AM

## 2016-11-03 DIAGNOSIS — D649 Anemia, unspecified: Secondary | ICD-10-CM

## 2016-11-03 DIAGNOSIS — E118 Type 2 diabetes mellitus with unspecified complications: Secondary | ICD-10-CM

## 2016-11-03 LAB — GLUCOSE, CAPILLARY
GLUCOSE-CAPILLARY: 135 mg/dL — AB (ref 65–99)
GLUCOSE-CAPILLARY: 37 mg/dL — AB (ref 65–99)
GLUCOSE-CAPILLARY: 76 mg/dL (ref 65–99)
GLUCOSE-CAPILLARY: 64 mg/dL — AB (ref 65–99)
Glucose-Capillary: 41 mg/dL — CL (ref 65–99)
Glucose-Capillary: 146 mg/dL — ABNORMAL HIGH (ref 65–99)
Glucose-Capillary: 50 mg/dL — ABNORMAL LOW (ref 65–99)
Glucose-Capillary: 93 mg/dL (ref 65–99)
Glucose-Capillary: 59 mg/dL — ABNORMAL LOW (ref 65–99)
Glucose-Capillary: 88 mg/dL (ref 65–99)

## 2016-11-03 LAB — BASIC METABOLIC PANEL
Anion gap: 7 (ref 5–15)
BUN: 14 mg/dL (ref 6–20)
CALCIUM: 8.2 mg/dL — AB (ref 8.9–10.3)
CO2: 24 mmol/L (ref 22–32)
Chloride: 103 mmol/L (ref 101–111)
Creatinine, Ser: 0.81 mg/dL (ref 0.44–1.00)
GFR calc Af Amer: >60 mL/min (ref >60)
GLUCOSE: 58 mg/dL — AB (ref 65–99)
Potassium: 2.9 mmol/L — ABNORMAL LOW (ref 3.5–5.1)
Sodium: 134 mmol/L — ABNORMAL LOW (ref 135–145)

## 2016-11-03 LAB — CBC WITH DIFFERENTIAL/PLATELET
Basophils Absolute: 0 10*3/uL (ref 0.0–0.1)
Basophils Relative: 0 %
EOS PCT: 0 %
Eosinophils Absolute: 0 10*3/uL (ref 0.0–0.7)
HEMATOCRIT: 29.2 % — AB (ref 36.0–46.0)
Hemoglobin: 9.4 g/dL — ABNORMAL LOW (ref 12.0–15.0)
LYMPHS ABS: 0.8 10*3/uL (ref 0.7–4.0)
Lymphocytes Relative: 10 %
MCH: 29 pg (ref 26.0–34.0)
MCHC: 32.2 g/dL (ref 30.0–36.0)
MCV: 90.1 fL (ref 78.0–100.0)
MONO ABS: 0.6 10*3/uL (ref 0.1–1.0)
MONOS PCT: 8 %
NEUTROS ABS: 6.7 10*3/uL (ref 1.7–7.7)
Neutrophils Relative %: 82 %
PLATELETS: 282 10*3/uL (ref 150–400)
RBC: 3.24 MIL/uL — ABNORMAL LOW (ref 3.87–5.11)
RDW: 15.1 % (ref 11.5–15.5)
WBC: 8.1 10*3/uL (ref 4.0–10.5)

## 2016-11-03 LAB — MAGNESIUM: Magnesium: 1.8 mg/dL (ref 1.7–2.4)

## 2016-11-03 LAB — HEMOGLOBIN A1C
HEMOGLOBIN A1C: 6.5 % — AB (ref 4.8–5.6)
Mean Plasma Glucose: 140 mg/dL

## 2016-11-03 LAB — PROTIME-INR
INR: 1.09
Prothrombin Time: 14.2 seconds (ref 11.4–15.2)

## 2016-11-03 LAB — TSH: TSH: 0.377 u[IU]/mL (ref 0.350–4.500)

## 2016-11-03 LAB — HIV ANTIBODY (ROUTINE TESTING W REFLEX): HIV SCREEN 4TH GENERATION: NONREACTIVE

## 2016-11-03 MED ORDER — DEXTROSE 50 % IV SOLN
INTRAVENOUS | Status: AC
  Filled 2016-11-03: qty 50

## 2016-11-03 MED ORDER — SODIUM CHLORIDE 0.9 % IV BOLUS (SEPSIS)
500.0000 mL | Freq: Once | INTRAVENOUS | Status: AC
  Administered 2016-11-03: 500 mL via INTRAVENOUS

## 2016-11-03 MED ORDER — SILVER SULFADIAZINE 1 % EX CREA
TOPICAL_CREAM | Freq: Two times a day (BID) | CUTANEOUS | Status: DC
Start: 2016-11-03 — End: 2016-11-06
  Administered 2016-11-03 – 2016-11-05 (×6): via TOPICAL
  Filled 2016-11-03: qty 50

## 2016-11-03 MED ORDER — POTASSIUM CHLORIDE CRYS ER 20 MEQ PO TBCR
40.0000 meq | EXTENDED_RELEASE_TABLET | ORAL | Status: AC
  Administered 2016-11-03 – 2016-11-04 (×3): 40 meq via ORAL
  Filled 2016-11-03 (×4): qty 2

## 2016-11-03 MED ORDER — DEXTROSE 50 % IV SOLN
50.0000 mL | Freq: Once | INTRAVENOUS | Status: AC
  Administered 2016-11-03: 50 mL via INTRAVENOUS

## 2016-11-03 MED ORDER — MORPHINE SULFATE (PF) 2 MG/ML IV SOLN
2.0000 mg | INTRAVENOUS | Status: DC | PRN
  Administered 2016-11-03 – 2016-11-07 (×15): 2 mg via INTRAVENOUS
  Filled 2016-11-03 (×16): qty 1

## 2016-11-03 MED ORDER — KCL IN DEXTROSE-NACL 20-5-0.45 MEQ/L-%-% IV SOLN
INTRAVENOUS | Status: DC
  Administered 2016-11-03: 17:00:00 via INTRAVENOUS

## 2016-11-03 NOTE — Progress Notes (Signed)
  Subjective: Patient has no significant complaints. She is hungry because she was made nothing by mouth yesterday afternoon.  Objective: Vital signs in last 24 hours: Temp:  [98 F (36.7 C)-98.8 F (37.1 C)] 98.1 F (36.7 C) (02/18 0500) Pulse Rate:  [64-94] 64 (02/18 0500) Resp:  [18] 18 (02/18 0500) BP: (80-117)/(41-62) 100/41 (02/18 0808) SpO2:  [91 %-96 %] 91 % (02/18 0500) Last BM Date: 10/31/16  Intake/Output from previous day: 02/17 0701 - 02/18 0700 In: 2150 [P.O.:600; I.V.:1500; IV Piggyback:50] Out: -  Intake/Output this shift: No intake/output data recorded.  General appearance: alert, cooperative and no distress Skin: Superficial necrosis of skin with a soft eschar present. No significant purulent drainage noted. No induration noted. Area measures approximately 3 x 5 cm. It is somewhat tender to touch. No significant drainage noted.  Lab Results:   Recent Labs  11/02/16 0607 11/03/16 0619  WBC 9.2 8.1  HGB 9.6* 9.4*  HCT 29.4* 29.2*  PLT 269 282   BMET  Recent Labs  11/02/16 0607 11/03/16 0619  NA 131* 134*  K 3.6 2.9*  CL 98* 103  CO2 21* 24  GLUCOSE 150* 58*  BUN 12 14  CREATININE 0.70 0.81  CALCIUM 8.5* 8.2*   PT/INR  Recent Labs  11/03/16 0619  LABPROT 14.2  INR 1.09    Studies/Results: Ct Femur Right W Contrast  Result Date: 11/01/2016 CLINICAL DATA:  Right thigh abscess along the inner thigh.  Fever. EXAM: CT OF THE LOWER RIGHT EXTREMITY WITH CONTRAST TECHNIQUE: Multidetector CT imaging of the lower right extremity was performed according to the standard protocol following intravenous contrast administration. COMPARISON:  None. CONTRAST:  100mL ISOVUE-300 IOPAMIDOL (ISOVUE-300) INJECTION 61% FINDINGS: Bones/Joint/Cartilage No fracture nor bone destruction of the right hip and visualized right hemipelvis. Slight joint space narrowing of the right hip. No intra-articular loose bodies. Gluteal calcific tendinopathy. The right SI joint  is maintained. The iliac bone and right pubic rami are intact. Ligaments Suboptimally assessed by CT. Muscles and Tendons No intramuscular abscess, mass or hemorrhage. Soft tissues There is a 4.2 x 5.4 x 1.9 cm (cc by AP by transverse) abscess with air-fluid noted within, it's upper margin at the right inguinal crease. IMPRESSION: 4.5 x 5.4 x 1.9 cm upper inner right thigh abscess with pockets of air and fluid noted within. No bony involvement. Electronically Signed   By: Tollie Ethavid  Kwon M.D.   On: 11/01/2016 22:07    Anti-infectives: Anti-infectives    Start     Dose/Rate Route Frequency Ordered Stop   11/02/16 0600  clindamycin (CLEOCIN) IVPB 900 mg     900 mg 100 mL/hr over 30 Minutes Intravenous Every 8 hours 11/02/16 0108     11/01/16 2200  clindamycin (CLEOCIN) IVPB 900 mg     900 mg 100 mL/hr over 30 Minutes Intravenous  Once 11/01/16 2158 11/01/16 2244      Assessment/Plan: Impression: Abscess with cellulitis and superficial skin necrosis, right thigh. Improving. No leukocytosis. Plan: I did offer surgical debridement in the operating room for the patient, but she is very resistant as she has had recent surgery and would like to try to treat this medically. We'll start Silvadene cream. This eschar may slough off on its own. Discussed with Dr. Kerry HoughMemon.  LOS: 2 days    Kaitlyn Dougherty A 11/03/2016

## 2016-11-03 NOTE — Progress Notes (Signed)
Hypoglycemic Event  CBG: 50 at 1127  Treatment: 15 GM carbohydrate snack  Symptoms: None  Follow-up CBG: Time:1222 CBG Result:93  Possible Reasons for Event: Inadequate meal intake  Comments/MD notified:Pt was previosly NPO, now on Carb Mod Diet. Was given snack and ate lunch.  Dr Kerry HoughMemon notified via AMION.    Ciro BackerBrower, Lynnette Pote R

## 2016-11-03 NOTE — Progress Notes (Signed)
Hypoglycemic Event  CBG: 37 at 0743  Treatment: D50 IV 50 mL  Symptoms: None  Follow-up CBG: Time:0805 CBG Result:146  Possible Reasons for Event: Inadequate meal intake  Comments/MD notified:Pt is NPO since midnight. CBG was 37, 50mL of Dextrose given.  Recheck CBG was 146.  Notified Dr Kerry HoughMemon via Loretha StaplerAMION.  Will continue to monitor.     Ciro BackerBrower, Hilda Rynders R

## 2016-11-03 NOTE — Progress Notes (Signed)
Pt complained of profuse sweating and requested that her blood sugar be checked. CBG 41. Hypoglycemia protocol initiated. IV dextrose administered and CBG recheck was 135. Pt tolerated well and states that she feels much better now. Will continue to monitor.  Vivi Fernslare Keon Pender, BSN, RN 11/03/2016 4:57 AM

## 2016-11-03 NOTE — Progress Notes (Signed)
PROGRESS NOTE  Kaitlyn Dougherty ZOX:[id number] DOB: Mar 10, 1961 DOA: 11/01/2016 PCP: Kaitlyn Flavin, MD  HPI/Recap of past 24 hours:  Continues to have right thigh pain around wound. Feels that it has gotten worse over the last several hours.  Assessment/Plan: Principal Problem:   Cellulitis and abscess of right lower extremity Active Problems:   Type 2 diabetes mellitus (HCC)   Hyperlipidemia   Essential hypertension   Hyponatremia   Anemia  Right thigh cellulitis/abscess CT right thigh" 4.5 x 5.4 x 1.9 cm upper inner right thigh abscess with pockets of air and fluid noted within. No bony involvement." Wound is draining, wound culture pending, blood cultures have not shown any growth, mrsa screening negative, no fever, no leukocytosis, bp low normal, lactic acid wnl, slight sinus tachycardia has improved On cleocin, local wound care, general surgery consulted, input appreciated.  Hyponatremia:  Na129, improving on ivf  Normocytic anemia:  No sign of active bleed, platelet wnl, INR pending, anemia work up pending, no need of prbc transfusion. Monitor hgb  noninsulin dependent dm2 A1c 6.5, on ssi here Metformin and glipizide currently on hold. She was having hypoglycemic episodes since she was nothing by mouth. Continue to monitor.  HTN; continue lisinopril , hold norvasc  HLD: continue statin  Body mass index is 32.52 kg/m.  Code Status: full  Family Communication: patient   Disposition Plan: home in a few days with general surgery clearance   Consultants:  General surgery  Procedures:  none  Antibiotics:  cleocin   Objective: BP (!) 107/43 (BP Location: Left Arm)   Pulse 72   Temp 98 F (36.7 C) (Oral)   Resp 20   Ht 5\' 6"  (1.676 m)   Wt 91.4 kg (201 lb 8 oz)   SpO2 100%   BMI 32.52 kg/m   Intake/Output Summary (Last 24 hours) at 11/03/16 1551 Last data filed at 11/03/16 1200  Gross per 24 hour  Intake              780 ml  Output                 0 ml  Net              780 ml   Filed Weights   11/01/16 1859 11/02/16 0135  Weight: 90.7 kg (200 lb) 91.4 kg (201 lb 8 oz)    Exam:   General:  NAD  Cardiovascular: RRR  Respiratory: CTABL  Abdomen: Soft/ND/NT, positive BS  Musculoskeletal: superficial skin necrosis in the upper, inner thigh. A small amount of drainage is noted. There is erythema and induration in the surrounding region. The superficial skin necrosis measures approximately 3-4 cm in diameter and has foul-smelling  Neuro: aaox3  Data Reviewed: Basic Metabolic Panel:  Recent Labs Lab 11/01/16 1926 11/02/16 0607 11/03/16 0619  NA 129* 131* 134*  K 3.9 3.6 2.9*  CL 93* 98* 103  CO2 25 21* 24  GLUCOSE 182* 150* 58*  BUN 11 12 14   CREATININE 0.75 0.70 0.81  CALCIUM 9.4 8.5* 8.2*  MG  --   --  1.8   Liver Function Tests:  Recent Labs Lab 11/02/16 0607  AST 27  ALT 34  ALKPHOS 123  BILITOT 0.8  PROT 6.5  ALBUMIN 2.7*   No results for input(s): LIPASE, AMYLASE in the last 168 hours. No results for input(s): AMMONIA in the last 168 hours. CBC:  Recent Labs Lab 11/01/16 1926 11/02/16 0607 11/03/16 0619  WBC  9.6 9.2 8.1  NEUTROABS 7.7 7.4 6.7  HGB 10.2* 9.6* 9.4*  HCT 31.1* 29.4* 29.2*  MCV 87.6 88.8 90.1  PLT 275 269 282   Cardiac Enzymes:   No results for input(s): CKTOTAL, CKMB, CKMBINDEX, TROPONINI in the last 168 hours. BNP (last 3 results) No results for input(s): BNP in the last 8760 hours.  ProBNP (last 3 results) No results for input(s): PROBNP in the last 8760 hours.  CBG:  Recent Labs Lab 11/03/16 0445 11/03/16 0743 11/03/16 0804 11/03/16 1127 11/03/16 1222  GLUCAP 135* 37* 146* 50* 93    Recent Results (from the past 240 hour(s))  Culture, blood (routine x 2)     Status: None (Preliminary result)   Collection Time: 11/01/16  7:57 PM  Result Value Ref Range Status   Specimen Description BLOOD RIGHT HAND  Final   Special Requests BOTTLES DRAWN AEROBIC AND  ANAEROBIC 5CC EACH  Final   Culture NO GROWTH 2 DAYS  Final   Report Status PENDING  Incomplete  Culture, blood (routine x 2)     Status: None (Preliminary result)   Collection Time: 11/01/16  8:00 PM  Result Value Ref Range Status   Specimen Description BLOOD LEFT HAND  Final   Special Requests BOTTLES DRAWN AEROBIC AND ANAEROBIC 6CC EACH  Final   Culture NO GROWTH 2 DAYS  Final   Report Status PENDING  Incomplete  MRSA PCR Screening     Status: None   Collection Time: 11/02/16  1:45 AM  Result Value Ref Range Status   MRSA by PCR NEGATIVE NEGATIVE Final    Comment:        The GeneXpert MRSA Assay (FDA approved for NASAL specimens only), is one component of a comprehensive MRSA colonization surveillance program. It is not intended to diagnose MRSA infection nor to guide or monitor treatment for MRSA infections.   Aerobic/Anaerobic Culture (surgical/deep wound)     Status: None (Preliminary result)   Collection Time: 11/02/16 10:54 AM  Result Value Ref Range Status   Specimen Description THIGH  Final   Special Requests NONE  Final   Gram Stain   Final    FEW WBC PRESENT, PREDOMINANTLY PMN MODERATE GRAM POSITIVE COCCI IN CLUSTERS    Culture   Final    CULTURE REINCUBATED FOR BETTER GROWTH Performed at [facility], [address] [place], [place] [address]    Report Status PENDING  Incomplete     Studies: No results found.  Scheduled Meds: . aspirin EC  81 mg Oral Daily  . atorvastatin  20 mg Oral Daily  . clindamycin (CLEOCIN) IV  900 mg Intravenous Q8H  . dextrose      . feeding supplement (PRO-STAT SUGAR FREE 64)  30 mL Oral BID  . gabapentin  400 mg Oral QPM  . heparin  5,000 Units Subcutaneous Q8H  . insulin aspart  0-15 Units Subcutaneous TID WC  . lisinopril  20 mg Oral Daily  . pantoprazole  40 mg Oral Daily  . potassium chloride  10 mEq Oral Daily  . potassium chloride  40 mEq Oral Q3H  . senna-docusate  1 tablet Oral QHS  . silver sulfADIAZINE    Topical BID    Continuous Infusions:    Time spent: 35mins  Kaitlyn Bokhari MD  [facility] Pager [phone]. If 7PM-7AM, please contact night-coverage at [email or web address], password [email or web address] 11/03/2016, 3:51 PM  LOS: 2 days

## 2016-11-04 LAB — GLUCOSE, CAPILLARY
GLUCOSE-CAPILLARY: 101 mg/dL — AB (ref 65–99)
GLUCOSE-CAPILLARY: 132 mg/dL — AB (ref 65–99)
Glucose-Capillary: 76 mg/dL (ref 65–99)
Glucose-Capillary: 48 mg/dL — ABNORMAL LOW (ref 65–99)
Glucose-Capillary: 150 mg/dL — ABNORMAL HIGH (ref 65–99)
Glucose-Capillary: 103 mg/dL — ABNORMAL HIGH (ref 65–99)
Glucose-Capillary: 92 mg/dL (ref 65–99)

## 2016-11-04 LAB — CBC
HCT: 27.7 % — ABNORMAL LOW (ref 36.0–46.0)
HEMOGLOBIN: 9.2 g/dL — AB (ref 12.0–15.0)
MCH: 29.7 pg (ref 26.0–34.0)
MCHC: 33.2 g/dL (ref 30.0–36.0)
MCV: 89.4 fL (ref 78.0–100.0)
Platelets: 287 10*3/uL (ref 150–400)
RBC: 3.1 MIL/uL — ABNORMAL LOW (ref 3.87–5.11)
RDW: 15.1 % (ref 11.5–15.5)
WBC: 8.3 10*3/uL (ref 4.0–10.5)

## 2016-11-04 LAB — BASIC METABOLIC PANEL
Anion gap: 7 (ref 5–15)
BUN: 16 mg/dL (ref 6–20)
CHLORIDE: 104 mmol/L (ref 101–111)
CO2: 20 mmol/L — AB (ref 22–32)
CREATININE: 0.65 mg/dL (ref 0.44–1.00)
Calcium: 8 mg/dL — ABNORMAL LOW (ref 8.9–10.3)
GFR calc Af Amer: >60 mL/min (ref >60)
GFR calc non Af Amer: >60 mL/min (ref >60)
Glucose, Bld: 84 mg/dL (ref 65–99)
Potassium: 5.2 mmol/L — ABNORMAL HIGH (ref 3.5–5.1)
SODIUM: 131 mmol/L — AB (ref 135–145)

## 2016-11-04 LAB — MAGNESIUM: MAGNESIUM: 1.7 mg/dL (ref 1.7–2.4)

## 2016-11-04 MED ORDER — ALUM & MAG HYDROXIDE-SIMETH 200-200-20 MG/5ML PO SUSP
15.0000 mL | Freq: Four times a day (QID) | ORAL | Status: DC | PRN
  Administered 2016-11-04: 15 mL via ORAL
  Filled 2016-11-04: qty 30

## 2016-11-04 MED ORDER — PIPERACILLIN-TAZOBACTAM 3.375 G IVPB
3.3750 g | Freq: Three times a day (TID) | INTRAVENOUS | Status: DC
Start: 2016-11-04 — End: 2016-11-07
  Administered 2016-11-04 – 2016-11-07 (×10): 3.375 g via INTRAVENOUS
  Filled 2016-11-04 (×10): qty 50

## 2016-11-04 MED ORDER — VANCOMYCIN HCL IN DEXTROSE 1-5 GM/200ML-% IV SOLN
1000.0000 mg | Freq: Two times a day (BID) | INTRAVENOUS | Status: DC
  Administered 2016-11-05 – 2016-11-07 (×5): 1000 mg via INTRAVENOUS
  Filled 2016-11-04 (×4): qty 200

## 2016-11-04 MED ORDER — VANCOMYCIN HCL 10 G IV SOLR
1500.0000 mg | Freq: Once | INTRAVENOUS | Status: AC
  Administered 2016-11-04: 1500 mg via INTRAVENOUS
  Filled 2016-11-04: qty 1500

## 2016-11-04 MED ORDER — POTASSIUM CHLORIDE CRYS ER 10 MEQ PO TBCR: 10.0000 meq | EXTENDED_RELEASE_TABLET | Freq: Every day | ORAL | Status: DC

## 2016-11-04 NOTE — Progress Notes (Signed)
  Subjective: No significant change in right thigh pain.  Objective: Vital signs in last 24 hours: Temp:  [98 F (36.7 C)-98.5 F (36.9 C)] 98.5 F (36.9 C) (02/19 0512) Pulse Rate:  [72-92] 92 (02/19 0512) Resp:  [20] 20 (02/19 0512) BP: (107-135)/(35-44) 119/44 (02/19 0512) SpO2:  [97 %-100 %] 98 % (02/19 0512) Last BM Date: 10/31/16  Intake/Output from previous day: 02/18 0701 - 02/19 0700 In: 1332.5 [P.O.:480; I.V.:652.5; IV Piggyback:200] Out: -  Intake/Output this shift: No intake/output data recorded.  General appearance: alert, cooperative and no distress Skin: Soft eschar with skin necrosis noted in right thigh, continues to have serous drainage. The induration seems to have improved.  Lab Results:   Recent Labs  11/03/16 0619 11/04/16 0601  WBC 8.1 8.3  HGB 9.4* 9.2*  HCT 29.2* 27.7*  PLT 282 287   BMET  Recent Labs  11/03/16 0619 11/04/16 0601  NA 134* 131*  K 2.9* 5.2*  CL 103 104  CO2 24 20*  GLUCOSE 58* 84  BUN 14 16  CREATININE 0.81 0.65  CALCIUM 8.2* 8.0*   PT/INR  Recent Labs  11/03/16 0619  LABPROT 14.2  INR 1.09    Studies/Results: No results found.  Anti-infectives: Anti-infectives    Start     Dose/Rate Route Frequency Ordered Stop   11/02/16 0600  clindamycin (CLEOCIN) IVPB 900 mg     900 mg 100 mL/hr over 30 Minutes Intravenous Every 8 hours 11/02/16 0108     11/01/16 2200  clindamycin (CLEOCIN) IVPB 900 mg     900 mg 100 mL/hr over 30 Minutes Intravenous  Once 11/01/16 2158 11/01/16 2244      Assessment/Plan: Impression: Cellulitis and abscess of right upper thigh. Plan: Patient is reconsidering surgical intervention. We will continue Silvadene cream to soften eschar and reassess in the morning.  LOS: 3 days    Kaitlyn Dougherty A 11/04/2016 

## 2016-11-04 NOTE — Progress Notes (Signed)
Hypoglycemic Event  CBG: 48  Treatment: 15 GM carbohydrate snack  Symptoms: Shaky  Follow-up CBG: Time: CBG Result:  150  Possible Reasons for Event: Unknown  Comments/MD notified:  Patient alert and oriented.  Patient reports eating yesterday and snacked a few times during the night to prevent hypoglycemia.  States she last ate something this morning at 0600.  Patient over 50% of her breakfast.  Recheck 150.    Dorene GrebeHAWKINS, Jadden Yim

## 2016-11-04 NOTE — Progress Notes (Signed)
Pharmacy Antibiotic Note  Kaitlyn Dougherty is a 56 y.o. female admitted on 11/01/2016 with abscess. [facility]  Plan:  Vancomycin 1500mg  x 1 then 1000mg  IV q12h Check trough at steady state Zosyn 3.375gm IV q8h, EID Monitor labs, renal fxn, progress and c/s Deescalate ABX when improved / appropriate.    Height: 5\' 6"  (167.6 cm) Weight: 201 lb 8 oz (91.4 kg) IBW/kg (Calculated) : 59.3  Temp (24hrs), Avg:98.5 F (36.9 C), Min:98.4 F (36.9 C), Max:98.5 F (36.9 C)   Recent Labs Lab 11/01/16 1926 11/01/16 2214 11/02/16 0607 11/03/16 0619 11/04/16 0601  WBC 9.6  --  9.2 8.1 8.3  CREATININE 0.75  --  0.70 0.81 0.65  LATICACIDVEN 0.8 0.7  --   --   --     Estimated Creatinine Clearance: 90.4 mL/min (by C-G formula based on SCr of 0.65 mg/dL).    Allergies  Allergen Reactions  . Adhesive [Tape] Itching and Rash    Please use "paper" tape    Antimicrobials this admission: Vanc 2/19 >>  Zosyn 2/19 >>   Dose adjustments this admission:  Microbiology results:  BCx: pending  UCx: pending   Sputum:    MRSA PCR:   Thank you for allowing pharmacy to be a part of this patient's care.  Valrie HartHall, Kaitlyn Dougherty A 11/04/2016 12:51 PM

## 2016-11-04 NOTE — Progress Notes (Signed)
PROGRESS NOTE  NIA NATHANIEL ZOX:[id number] DOB: 03/28/1961 DOA: 11/01/2016 PCP: Selinda Flavin, MD  HPI/Recap of past 24 hours: Continues to have right thigh pain around wound. Feels that it has gotten worse over the last several hours.  Assessment/Plan: Principal Problem:   Cellulitis and abscess of right lower extremity Active Problems:   Type 2 diabetes mellitus (HCC)   Hyperlipidemia   Essential hypertension   Hyponatremia   Anemia  Right thigh cellulitis/abscess CT right thigh" 4.5 x 5.4 x 1.9 cm upper inner right thigh abscess with pockets of air and fluid noted within. No bony involvement." Wound is draining, wound culture pending, blood cultures have not shown any growth, mrsa screening negative, no fever, no leukocytosis, bp low normal, lactic acid wnl, slight sinus tachycardia has improved On cleocin, local wound care, general surgery consulted, input appreciated Skin looks necrotic, patient is diabetic, but foul-smelling discharge, will expand coverage, change clindamycin to vancomycin and Zosyn.  Hyponatremia: Likely diuretic induced. [id number], improving, IV fluids had been discontinued. - BMP a.m. - Regular diet - Diuretics held  Normocytic anemia: Hemoglobin 9.2 No sign of active bleed, platelet wnl, INR pending, anemia work up pending, no need of prbc transfusion. Serum iron low at 10, reduced TIBC, elevated ferritin in the setting of acute infection tibc suggesting anemia of chronic disease  noninsulin dependent dm2 A1c 6.5, on ssi here Metformin and glipizide currently on hold. She was having hypoglycemic episodes since she was nothing by mouth. Continue to monitor. Regular diet  HTN; continue lisinopril , hold norvasc  HLD: continue statin  Body mass index is 32.52 kg/m.  Code Status: full  Family Communication: patient   Disposition Plan: pending surgical debridement  Consultants:  General  surgery  Procedures:  none  Antibiotics:  Clindamycin- 2/16>>2/19  Vanc 2/19>>  Zosyn 2/19 >>  subjective- complaints of pain in her right upper thigh, she has agreed to surgery. Also blood sugars were running 80 to low earlier this morning.  Objective: BP 136/61 (BP Location: Right Arm)   Pulse 99   Temp (!) 101.4 F (38.6 C) (Oral)   Resp 18   Ht 5\' 6"  (1.676 m)   Wt 91.4 kg (201 lb 8 oz)   SpO2 99%   BMI 32.52 kg/m   Intake/Output Summary (Last 24 hours) at 11/04/16 1811 Last data filed at 11/04/16 0400  Gross per 24 hour  Intake              645 ml  Output                0 ml  Net              645 ml   Filed Weights   11/01/16 1859 11/02/16 0135  Weight: 90.7 kg (200 lb) 91.4 kg (201 lb 8 oz)    Exam:   General:  NAD  Cardiovascular: RRR  Respiratory: CTABL  Abdomen: Soft/ND/NT, positive BS, right side well healed surgical incision with well healed smaller scar in LLQ.  Musculoskeletal: superficial skin necrosis in the upper, inner thigh. Significant amount of drainage is noted. There is erythema and induration in the surrounding region. The superficial skin necrosis measures approximately ~6 cm in diameter and has foul-smelling discharge  Neuro: aaox3  Data Reviewed: Basic Metabolic Panel:  Recent Labs Lab 11/01/16 1926 11/02/16 0607 11/03/16 0619 11/04/16 0601  NA 129* 131* 134* 131*  K 3.9 3.6 2.9* 5.2*  CL 93* 98* 103 104  CO2  25 21* 24 20*  GLUCOSE 182* 150* 58* 84  BUN 11 12 14 16   CREATININE 0.75 0.70 0.81 0.65  CALCIUM 9.4 8.5* 8.2* 8.0*  MG  --   --  1.8 1.7   Liver Function Tests:  Recent Labs Lab 11/02/16 0607  AST 27  ALT 34  ALKPHOS 123  BILITOT 0.8  PROT 6.5  ALBUMIN 2.7*   CBC:  Recent Labs Lab 11/01/16 1926 11/02/16 0607 11/03/16 0619 11/04/16 0601  WBC 9.6 9.2 8.1 8.3  NEUTROABS 7.7 7.4 6.7  --   HGB 10.2* 9.6* 9.4* 9.2*  HCT 31.1* 29.4* 29.2* 27.7*  MCV 87.6 88.8 90.1 89.4  PLT 275 269 282 287    CBG:  Recent Labs Lab 11/04/16 0508 11/04/16 0807 11/04/16 0909 11/04/16 1158 11/04/16 1635  GLUCAP 101* 48* 150* 103* 92    Recent Results (from the past 240 hour(s))  Culture, blood (routine x 2)     Status: None (Preliminary result)   Collection Time: 11/01/16  7:57 PM  Result Value Ref Range Status   Specimen Description BLOOD RIGHT HAND  Final   Special Requests BOTTLES DRAWN AEROBIC AND ANAEROBIC 5CC EACH  Final   Culture NO GROWTH 3 DAYS  Final   Report Status PENDING  Incomplete  Culture, blood (routine x 2)     Status: None (Preliminary result)   Collection Time: 11/01/16  8:00 PM  Result Value Ref Range Status   Specimen Description BLOOD LEFT HAND  Final   Special Requests BOTTLES DRAWN AEROBIC AND ANAEROBIC 6CC EACH  Final   Culture NO GROWTH 3 DAYS  Final   Report Status PENDING  Incomplete  MRSA PCR Screening     Status: None   Collection Time: 11/02/16  1:45 AM  Result Value Ref Range Status   MRSA by PCR NEGATIVE NEGATIVE Final    Comment:        The GeneXpert MRSA Assay (FDA approved for NASAL specimens only), is one component of a comprehensive MRSA colonization surveillance program. It is not intended to diagnose MRSA infection nor to guide or monitor treatment for MRSA infections.   Aerobic/Anaerobic Culture (surgical/deep wound)     Status: None (Preliminary result)   Collection Time: 11/02/16 10:54 AM  Result Value Ref Range Status   Specimen Description THIGH  Final   Special Requests NONE  Final   Gram Stain   Final    FEW WBC PRESENT, PREDOMINANTLY PMN MODERATE GRAM POSITIVE COCCI IN CLUSTERS Performed at [facility], [address] [place], [place] [address]    Culture   Final    CULTURE REINCUBATED FOR BETTER GROWTH NO ANAEROBES ISOLATED; CULTURE IN PROGRESS FOR 5 DAYS    Report Status PENDING  Incomplete     Studies: No results found.  Scheduled Meds: . aspirin EC  81 mg Oral Daily  . atorvastatin  20 mg Oral  Daily  . feeding supplement (PRO-STAT SUGAR FREE 64)  30 mL Oral BID  . gabapentin  400 mg Oral QPM  . heparin  5,000 Units Subcutaneous Q8H  . insulin aspart  0-15 Units Subcutaneous TID WC  . lisinopril  20 mg Oral Daily  . pantoprazole  40 mg Oral Daily  . piperacillin-tazobactam (ZOSYN)  IV  3.375 g Intravenous Q8H  . senna-docusate  1 tablet Oral QHS  . silver sulfADIAZINE   Topical BID  . [START ON 11/05/2016] vancomycin  1,000 mg Intravenous Q12H    Continuous Infusions:  Time spent:  Onnie Boer MD  [facility] Pager [phone] . If 7PM-7AM, please contact night-coverage at [email or web address], password [facility] 11/04/2016, 6:11 PM  LOS: 3 days

## 2016-11-05 LAB — CBC
HEMATOCRIT: 30.1 % — AB (ref 36.0–46.0)
Hemoglobin: 9.8 g/dL — ABNORMAL LOW (ref 12.0–15.0)
MCH: 29.1 pg (ref 26.0–34.0)
MCHC: 32.6 g/dL (ref 30.0–36.0)
MCV: 89.3 fL (ref 78.0–100.0)
PLATELETS: 348 10*3/uL (ref 150–400)
RBC: 3.37 MIL/uL — AB (ref 3.87–5.11)
RDW: 15 % (ref 11.5–15.5)
WBC: 7.1 10*3/uL (ref 4.0–10.5)

## 2016-11-05 LAB — BASIC METABOLIC PANEL
Anion gap: 8 (ref 5–15)
BUN: 11 mg/dL (ref 6–20)
CALCIUM: 8.7 mg/dL — AB (ref 8.9–10.3)
CO2: 25 mmol/L (ref 22–32)
CREATININE: 0.62 mg/dL (ref 0.44–1.00)
Chloride: 98 mmol/L — ABNORMAL LOW (ref 101–111)
GFR calc Af Amer: >60 mL/min (ref >60)
GFR calc non Af Amer: >60 mL/min (ref >60)
GLUCOSE: 157 mg/dL — AB (ref 65–99)
Potassium: 4.7 mmol/L (ref 3.5–5.1)
Sodium: 131 mmol/L — ABNORMAL LOW (ref 135–145)

## 2016-11-05 LAB — GLUCOSE, CAPILLARY
GLUCOSE-CAPILLARY: 227 mg/dL — AB (ref 65–99)
GLUCOSE-CAPILLARY: 320 mg/dL — AB (ref 65–99)
Glucose-Capillary: 131 mg/dL — ABNORMAL HIGH (ref 65–99)
Glucose-Capillary: 227 mg/dL — ABNORMAL HIGH (ref 65–99)
Glucose-Capillary: 222 mg/dL — ABNORMAL HIGH (ref 65–99)

## 2016-11-05 LAB — SURGICAL PCR SCREEN
MRSA, PCR: NEGATIVE
Staphylococcus aureus: NEGATIVE

## 2016-11-05 LAB — TYPE AND SCREEN
ABO/RH(D): AB POS
Antibody Screen: NEGATIVE

## 2016-11-05 MED ORDER — CHLORHEXIDINE GLUCONATE CLOTH 2 % EX PADS
6.0000 | MEDICATED_PAD | Freq: Once | CUTANEOUS | Status: AC
  Administered 2016-11-06: 6 via TOPICAL

## 2016-11-05 MED ORDER — INSULIN ASPART 100 UNIT/ML ~~LOC~~ SOLN
6.0000 [IU] | Freq: Once | SUBCUTANEOUS | Status: AC
  Administered 2016-11-05: 6 [IU] via SUBCUTANEOUS

## 2016-11-05 MED ORDER — OXYCODONE-ACETAMINOPHEN 5-325 MG PO TABS
2.0000 | ORAL_TABLET | Freq: Four times a day (QID) | ORAL | Status: DC
  Administered 2016-11-05 – 2016-11-07 (×9): 2 via ORAL
  Filled 2016-11-05 (×10): qty 2

## 2016-11-05 MED ORDER — INSULIN ASPART 100 UNIT/ML ~~LOC~~ SOLN
0.0000 [IU] | Freq: Three times a day (TID) | SUBCUTANEOUS | Status: DC
  Administered 2016-11-06: 3 [IU] via SUBCUTANEOUS
  Administered 2016-11-06: 2 [IU] via SUBCUTANEOUS
  Administered 2016-11-07: 5 [IU] via SUBCUTANEOUS
  Administered 2016-11-07: 3 [IU] via SUBCUTANEOUS

## 2016-11-05 MED ORDER — CHLORHEXIDINE GLUCONATE CLOTH 2 % EX PADS: 6.0000 | MEDICATED_PAD | Freq: Once | CUTANEOUS | Status: AC

## 2016-11-05 MED ORDER — LORAZEPAM 0.5 MG PO TABS
0.5000 mg | ORAL_TABLET | Freq: Once | ORAL | Status: AC
  Administered 2016-11-05: 0.5 mg via ORAL
  Filled 2016-11-05: qty 1

## 2016-11-05 NOTE — Progress Notes (Signed)
Pt very anxious about PICC line placement and debridement for tomorrow. Requesting anxiety medication. Dr. Craige CottaKirby on call paged.

## 2016-11-05 NOTE — Progress Notes (Signed)
Pt requesting to have Lactic acid discontinued every 3 hours stating she didn't have any veins. Last result 0.7, MD on call paged

## 2016-11-05 NOTE — Progress Notes (Signed)
Pts blood sugar 326, no SSI ordered until tomorrow. On call doctor, Dr. Craige CottaKirby paged and made aware of blood sugar as well as pt being NPO after MN for debridement tomorrow

## 2016-11-05 NOTE — Progress Notes (Signed)
  Subjective: No significant changes.  Objective: Vital signs in last 24 hours: Temp:  [97.8 F (36.6 C)-101.4 F (38.6 C)] 97.8 F (36.6 C) (02/20 0628) Pulse Rate:  [88-99] 88 (02/20 0628) Resp:  [18-80] 80 (02/20 0628) BP: (103-143)/(44-70) 138/64 (02/20 0628) SpO2:  [99 %-100 %] 100 % (02/20 0628) Last BM Date: 10/31/16  Intake/Output from previous day: 02/19 0701 - 02/20 0700 In: 1020 [P.O.:920; IV Piggyback:100] Out: 300 [Urine:300] Intake/Output this shift: No intake/output data recorded.  General appearance: alert, cooperative and no distress Skin: 5-6 cm soft eschar with skin necrosis present. Still with malodorous drainage. Decreased.  Lab Results:   Recent Labs  11/03/16 0619 11/04/16 0601  WBC 8.1 8.3  HGB 9.4* 9.2*  HCT 29.2* 27.7*  PLT 282 287   BMET  Recent Labs  11/04/16 0601 11/05/16 0457  NA 131* 131*  K 5.2* 4.7  CL 104 98*  CO2 20* 25  GLUCOSE 84 157*  BUN 16 11  CREATININE 0.65 0.62  CALCIUM 8.0* 8.7*   PT/INR  Recent Labs  11/03/16 0619  LABPROT 14.2  INR 1.09    Studies/Results: No results found.  Anti-infectives: Anti-infectives    Start     Dose/Rate Route Frequency Ordered Stop   11/05/16 0600  vancomycin (VANCOCIN) IVPB 1000 mg/200 mL premix     1,000 mg 200 mL/hr over 60 Minutes Intravenous Every 12 hours 11/04/16 1250     11/04/16 1230  vancomycin (VANCOCIN) 1,500 mg in sodium chloride 0.9 % 500 mL IVPB     1,500 mg 250 mL/hr over 120 Minutes Intravenous  Once 11/04/16 1140 11/04/16 1530   11/04/16 1200  piperacillin-tazobactam (ZOSYN) IVPB 3.375 g     3.375 g 12.5 mL/hr over 240 Minutes Intravenous Every 8 hours 11/04/16 1139     11/02/16 0600  clindamycin (CLEOCIN) IVPB 900 mg  Status:  Discontinued     900 mg 100 mL/hr over 30 Minutes Intravenous Every 8 hours 11/02/16 0108 11/04/16 1119   11/01/16 2200  clindamycin (CLEOCIN) IVPB 900 mg     900 mg 100 mL/hr over 30 Minutes Intravenous  Once 11/01/16  2158 11/01/16 2244      Assessment/Plan: Impression: Abscess with skin necrosis, right thigh Plan: Will take patient to the operating room tomorrow for debridement of right thigh wound. The risks and benefits of the procedure were fully explained to the patient, who gave informed consent.  LOS: 4 days    Kaitlyn Dougherty A 11/05/2016

## 2016-11-05 NOTE — Progress Notes (Signed)
PROGRESS NOTE  Kaitlyn Dougherty GNF:[id number] DOB: 07-15-1961 DOA: 11/01/2016 PCP: Selinda Flavin, MD  HPI/Recap of past 24 hours: Continues to have right thigh pain around wound. Feels that it has gotten worse over the last several hours.  Assessment/Plan: Principal Problem:   Cellulitis and abscess of right lower extremity Active Problems:   Type 2 diabetes mellitus (HCC)   Hyperlipidemia   Essential hypertension   Hyponatremia   Anemia  Right thigh cellulitis/abscess- For surgery 2/21, bridement. - CT right thigh" 4.5 x 5.4 x 1.9 cm upper inner right thigh abscess with pockets of air and fluid noted within. No bony involvement." - Wound is draining, wound culture pending,  - blood cultures have not shown any growth, mrsa screening negative, no fever, no leukocytosis,  - Skin looks necrotic, patient is diabetic, with significant amount of foul-smelling discharge, changed abtic to vancomycin and Zosyn, with high risk of polymicrobial infections in diabetics, with worsening purulent drainage  Hyponatremia: Likely diuretic induced. Improved, IV fluids had been discontinued. - Diuretics held  Normocytic anemia: Hemoglobin 9.2 No sign of active bleed, platelet wnl, INR pending, anemia work up pending Serum iron low at 10, reduced TIBC, elevated ferritin in the setting of acute infection, but tibc suggesting anemia of chronic disease  noninsulin dependent dm2 - A1c 6.5 - Home Metformin and glipizide currently on hold.  - She was having hypoglycemic episodes since she was nothing by mouth- Resolved. Change regular diet to Carb mod diet. Restart SSI.  HTN; continue lisinopril , hold norvasc, with soft BP.  HLD: continue statin  Body mass index is 32.52 kg/m.  Code Status: full  Family Communication: patient   Disposition Plan: pending surgical debridement  Consultants:  General surgery  Procedures:  none  Antibiotics:  Clindamycin- 2/16>>2/19  Vanc  2/19>>  Zosyn 2/19 >>  subjective- Pain feels worse, with increased drainage. She is planned for surg 2/21.   Objective: BP (!) 127/51 (BP Location: Right Arm)   Pulse 65   Temp 98.4 F (36.9 C) (Oral)   Resp 16   Ht 5\' 6"  (1.676 m)   Wt 91.4 kg (201 lb 8 oz)   SpO2 99%   BMI 32.52 kg/m   Intake/Output Summary (Last 24 hours) at 11/05/16 1708 Last data filed at 11/05/16 1444  Gross per 24 hour  Intake              970 ml  Output             1500 ml  Net             -530 ml   Filed Weights   11/01/16 1859 11/02/16 0135  Weight: 90.7 kg (200 lb) 91.4 kg (201 lb 8 oz)    Exam:   General:  NAD  Cardiovascular: RRR  Respiratory: CTABL  Abdomen: Soft/ND/NT, positive BS, right side well healed surgical incision with well healed smaller scar in LLQ.  Musculoskeletal: There is erythema and induration in the surrounding region. The superficial skin necrosis, with surrounding purulence measures approximately ~8 cm in diameter and has foul-smelling discharge, Obvious on entering the room.   Neuro: aaox3  Data Reviewed: Basic Metabolic Panel:  Recent Labs Lab 11/01/16 1926 11/02/16 0607 11/03/16 0619 11/04/16 0601 11/05/16 0457  NA 129* 131* 134* 131* 131*  K 3.9 3.6 2.9* 5.2* 4.7  CL 93* 98* 103 104 98*  CO2 25 21* 24 20* 25  GLUCOSE 182* 150* 58* 84 157*  BUN 11  12 14 16 11   CREATININE 0.75 0.70 0.81 0.65 0.62  CALCIUM 9.4 8.5* 8.2* 8.0* 8.7*  MG  --   --  1.8 1.7  --    Liver Function Tests:  Recent Labs Lab 11/02/16 0607  AST 27  ALT 34  ALKPHOS 123  BILITOT 0.8  PROT 6.5  ALBUMIN 2.7*   CBC:  Recent Labs Lab 11/01/16 1926 11/02/16 0607 11/03/16 0619 11/04/16 0601 11/05/16 1302  WBC 9.6 9.2 8.1 8.3 7.1  NEUTROABS 7.7 7.4 6.7  --   --   HGB 10.2* 9.6* 9.4* 9.2* 9.8*  HCT 31.1* 29.4* 29.2* 27.7* 30.1*  MCV 87.6 88.8 90.1 89.4 89.3  PLT 275 269 282 287 348   CBG:  Recent Labs Lab 11/04/16 2056 11/05/16 0232 11/05/16 0817  11/05/16 1116 11/05/16 1706  GLUCAP 132* 131* 227* 222* 227*    Recent Results (from the past 240 hour(s))  Culture, blood (routine x 2)     Status: None (Preliminary result)   Collection Time: 11/01/16  7:57 PM  Result Value Ref Range Status   Specimen Description BLOOD RIGHT HAND  Final   Special Requests BOTTLES DRAWN AEROBIC AND ANAEROBIC 5CC EACH  Final   Culture NO GROWTH 4 DAYS  Final   Report Status PENDING  Incomplete  Culture, blood (routine x 2)     Status: None (Preliminary result)   Collection Time: 11/01/16  8:00 PM  Result Value Ref Range Status   Specimen Description BLOOD LEFT HAND  Final   Special Requests BOTTLES DRAWN AEROBIC AND ANAEROBIC 6CC EACH  Final   Culture NO GROWTH 4 DAYS  Final   Report Status PENDING  Incomplete  MRSA PCR Screening     Status: None   Collection Time: 11/02/16  1:45 AM  Result Value Ref Range Status   MRSA by PCR NEGATIVE NEGATIVE Final    Comment:        The GeneXpert MRSA Assay (FDA approved for NASAL specimens only), is one component of a comprehensive MRSA colonization surveillance program. It is not intended to diagnose MRSA infection nor to guide or monitor treatment for MRSA infections.   Aerobic/Anaerobic Culture (surgical/deep wound)     Status: None (Preliminary result)   Collection Time: 11/02/16 10:54 AM  Result Value Ref Range Status   Specimen Description THIGH  Final   Special Requests NONE  Final   Gram Stain   Final    FEW WBC PRESENT, PREDOMINANTLY PMN MODERATE GRAM POSITIVE COCCI IN CLUSTERS Performed at [facility], [address], [place], [place] [address]    Culture   Final    NORMAL SKIN FLORA NO ANAEROBES ISOLATED; CULTURE IN PROGRESS FOR 5 DAYS    Report Status PENDING  Incomplete     Studies: No results found.  Scheduled Meds: . aspirin EC  81 mg Oral Daily  . atorvastatin  20 mg Oral Daily  . Chlorhexidine Gluconate Cloth  6 each Topical Once   And  . Chlorhexidine Gluconate  Cloth  6 each Topical Once  . feeding supplement (PRO-STAT SUGAR FREE 64)  30 mL Oral BID  . gabapentin  400 mg Oral QPM  . heparin  5,000 Units Subcutaneous Q8H  . lisinopril  20 mg Oral Daily  . oxyCODONE-acetaminophen  2 tablet Oral Q6H  . pantoprazole  40 mg Oral Daily  . piperacillin-tazobactam (ZOSYN)  IV  3.375 g Intravenous Q8H  . senna-docusate  1 tablet Oral QHS  .  silver sulfADIAZINE   Topical BID  . vancomycin  1,000 mg Intravenous Q12H   Continuous Infusions: None   Onnie BoerEjiroghene E Myca Perno MD  [facility] Pager [phone] . If 7PM-7AM, please contact night-coverage at [email or web address], password [email or web address] 11/05/2016, 5:08 PM  LOS: 4 days

## 2016-11-05 NOTE — Plan of Care (Signed)
Problem: Pain Managment: Goal: General experience of comfort will improve Outcome: Not Progressing Pts pain mgmt is not improving. Pt received Morphine & Oxycodone q4h this shift, pain is stated anywhere from 6-10.

## 2016-11-06 ENCOUNTER — Encounter (HOSPITAL_COMMUNITY)
Admission: EM | Disposition: A | Discharge status: Home / Self Care | Payer: Self-pay | Source: Home / Self Care | Attending: Internal Medicine

## 2016-11-06 ENCOUNTER — Inpatient Hospital Stay (HOSPITAL_COMMUNITY): Payer: PRIVATE HEALTH INSURANCE | Admitting: Anesthesiology

## 2016-11-06 ENCOUNTER — Encounter (HOSPITAL_COMMUNITY): Payer: Self-pay | Admitting: *Deleted

## 2016-11-06 DIAGNOSIS — I10 Essential (primary) hypertension: Secondary | ICD-10-CM

## 2016-11-06 DIAGNOSIS — E871 Hypo-osmolality and hyponatremia: Secondary | ICD-10-CM

## 2016-11-06 DIAGNOSIS — L02415 Cutaneous abscess of right lower limb: Principal | ICD-10-CM

## 2016-11-06 DIAGNOSIS — E7800 Pure hypercholesterolemia, unspecified: Secondary | ICD-10-CM

## 2016-11-06 DIAGNOSIS — L03115 Cellulitis of right lower limb: Secondary | ICD-10-CM

## 2016-11-06 HISTORY — PX: INCISION AND DRAINAGE OF WOUND: SHX1803

## 2016-11-06 LAB — GLUCOSE, CAPILLARY
GLUCOSE-CAPILLARY: 213 mg/dL — AB (ref 65–99)
GLUCOSE-CAPILLARY: 231 mg/dL — AB (ref 65–99)
GLUCOSE-CAPILLARY: 200 mg/dL — AB (ref 65–99)
GLUCOSE-CAPILLARY: 208 mg/dL — AB (ref 65–99)
GLUCOSE-CAPILLARY: 172 mg/dL — AB (ref 65–99)
GLUCOSE-CAPILLARY: 238 mg/dL — AB (ref 65–99)

## 2016-11-06 LAB — BASIC METABOLIC PANEL
ANION GAP: 9 (ref 5–15)
BUN: 15 mg/dL (ref 6–20)
CALCIUM: 8.8 mg/dL — AB (ref 8.9–10.3)
CHLORIDE: 96 mmol/L — AB (ref 101–111)
CO2: 26 mmol/L (ref 22–32)
Creatinine, Ser: 0.62 mg/dL (ref 0.44–1.00)
GFR calc Af Amer: >60 mL/min (ref >60)
GFR calc non Af Amer: >60 mL/min (ref >60)
GLUCOSE: 184 mg/dL — AB (ref 65–99)
Potassium: 4.9 mmol/L (ref 3.5–5.1)
Sodium: 131 mmol/L — ABNORMAL LOW (ref 135–145)

## 2016-11-06 LAB — CULTURE, BLOOD (ROUTINE X 2)
CULTURE: NO GROWTH
CULTURE: NO GROWTH

## 2016-11-06 SURGERY — IRRIGATION AND DEBRIDEMENT WOUND
Anesthesia: General | Site: Thigh | Laterality: Right

## 2016-11-06 MED ORDER — FENTANYL CITRATE (PF) 100 MCG/2ML IJ SOLN
INTRAMUSCULAR | Status: AC
  Filled 2016-11-06: qty 2

## 2016-11-06 MED ORDER — MIDAZOLAM HCL 2 MG/2ML IJ SOLN
INTRAMUSCULAR | Status: AC
  Filled 2016-11-06: qty 2

## 2016-11-06 MED ORDER — PROPOFOL 10 MG/ML IV BOLUS
INTRAVENOUS | Status: DC | PRN
  Administered 2016-11-06: 150 mg via INTRAVENOUS

## 2016-11-06 MED ORDER — CHLORHEXIDINE GLUCONATE CLOTH 2 % EX PADS
6.0000 | MEDICATED_PAD | Freq: Once | CUTANEOUS | Status: DC
Start: 2016-11-06 — End: 2016-11-06

## 2016-11-06 MED ORDER — LACTATED RINGERS IV SOLN
INTRAVENOUS | Status: DC
  Administered 2016-11-06: 1000 mL via INTRAVENOUS

## 2016-11-06 MED ORDER — CHLORHEXIDINE GLUCONATE CLOTH 2 % EX PADS: 6.0000 | MEDICATED_PAD | Freq: Once | CUTANEOUS | Status: DC

## 2016-11-06 MED ORDER — INSULIN DETEMIR 100 UNIT/ML ~~LOC~~ SOLN
5.0000 [IU] | Freq: Every day | SUBCUTANEOUS | Status: DC
  Administered 2016-11-06: 5 [IU] via SUBCUTANEOUS
  Filled 2016-11-06 (×2): qty 0.05

## 2016-11-06 MED ORDER — PHENYLEPHRINE 40 MCG/ML (10ML) SYRINGE FOR IV PUSH (FOR BLOOD PRESSURE SUPPORT)
PREFILLED_SYRINGE | INTRAVENOUS | Status: AC
  Filled 2016-11-06: qty 10

## 2016-11-06 MED ORDER — LIDOCAINE HCL (PF) 1 % IJ SOLN
INTRAMUSCULAR | Status: AC
  Filled 2016-11-06: qty 5

## 2016-11-06 MED ORDER — HYDROMORPHONE HCL 1 MG/ML IJ SOLN
0.5000 mg | INTRAMUSCULAR | Status: DC | PRN
  Administered 2016-11-06 (×5): 0.5 mg via INTRAVENOUS
  Filled 2016-11-06 (×3): qty 0.5

## 2016-11-06 MED ORDER — FENTANYL CITRATE (PF) 100 MCG/2ML IJ SOLN: 25.0000 ug | INTRAMUSCULAR | Status: DC | PRN

## 2016-11-06 MED ORDER — PHENYLEPHRINE HCL 10 MG/ML IJ SOLN
INTRAMUSCULAR | Status: AC
  Filled 2016-11-06: qty 1

## 2016-11-06 MED ORDER — PROPOFOL 10 MG/ML IV BOLUS
INTRAVENOUS | Status: AC
  Filled 2016-11-06: qty 40

## 2016-11-06 MED ORDER — HYDROMORPHONE HCL 1 MG/ML IJ SOLN
INTRAMUSCULAR | Status: AC
  Filled 2016-11-06: qty 0.5

## 2016-11-06 MED ORDER — KETOROLAC TROMETHAMINE 30 MG/ML IJ SOLN
30.0000 mg | Freq: Once | INTRAMUSCULAR | Status: AC
Start: 2016-11-06 — End: 2016-11-06
  Administered 2016-11-06: 30 mg via INTRAVENOUS
  Filled 2016-11-06: qty 1

## 2016-11-06 MED ORDER — FENTANYL CITRATE (PF) 100 MCG/2ML IJ SOLN
INTRAMUSCULAR | Status: DC | PRN
Start: 2016-11-06 — End: 2016-11-06
  Administered 2016-11-06 (×4): 25 ug via INTRAVENOUS

## 2016-11-06 MED ORDER — ONDANSETRON HCL 4 MG/2ML IJ SOLN: 4.0000 mg | Freq: Once | INTRAMUSCULAR | Status: DC

## 2016-11-06 MED ORDER — 0.9 % SODIUM CHLORIDE (POUR BTL) OPTIME
TOPICAL | Status: DC | PRN
  Administered 2016-11-06: 1000 mL

## 2016-11-06 MED ORDER — LIDOCAINE HCL (CARDIAC) 10 MG/ML IV SOLN
INTRAVENOUS | Status: DC | PRN
Start: 2016-11-06 — End: 2016-11-06
  Administered 2016-11-06: 50 mg via INTRAVENOUS

## 2016-11-06 MED ORDER — ONDANSETRON HCL 4 MG/2ML IJ SOLN
4.0000 mg | Freq: Once | INTRAMUSCULAR | Status: AC
  Administered 2016-11-06: 4 mg via INTRAVENOUS

## 2016-11-06 MED ORDER — MIDAZOLAM HCL 2 MG/2ML IJ SOLN
1.0000 mg | INTRAMUSCULAR | Status: DC
  Administered 2016-11-06: 2 mg via INTRAVENOUS

## 2016-11-06 SURGICAL SUPPLY — 28 items
BAG HAMPER (MISCELLANEOUS) ×3 IMPLANT
BLADE 11 SAFETY STRL DISP (BLADE) ×3 IMPLANT
BNDG CONFORM 2 STRL LF (GAUZE/BANDAGES/DRESSINGS) ×3 IMPLANT
CLOTH BEACON ORANGE TIMEOUT ST (SAFETY) ×3 IMPLANT
COVER LIGHT HANDLE STERIS (MISCELLANEOUS) ×6 IMPLANT
DECANTER SPIKE VIAL GLASS SM (MISCELLANEOUS) IMPLANT
DRAPE PROXIMA HALF (DRAPES) ×3 IMPLANT
ELECT REM PT RETURN 9FT ADLT (ELECTROSURGICAL) ×3
ELECTRODE REM PT RTRN 9FT ADLT (ELECTROSURGICAL) ×1 IMPLANT
GAUZE PACKING IODOFORM 1 (PACKING) IMPLANT
GAUZE SPONGE 4X4 12PLY STRL (GAUZE/BANDAGES/DRESSINGS) ×3 IMPLANT
GLOVE BIOGEL PI IND STRL 7.0 (GLOVE) ×1 IMPLANT
GLOVE BIOGEL PI INDICATOR 7.0 (GLOVE) ×2
GLOVE SURG SS PI 7.5 STRL IVOR (GLOVE) ×6 IMPLANT
GOWN STRL REUS W/TWL LRG LVL3 (GOWN DISPOSABLE) ×9 IMPLANT
KIT ROOM TURNOVER APOR (KITS) ×3 IMPLANT
MANIFOLD NEPTUNE II (INSTRUMENTS) ×3 IMPLANT
NS IRRIG 1000ML POUR BTL (IV SOLUTION) ×3 IMPLANT
PACK BASIC LIMB (CUSTOM PROCEDURE TRAY) IMPLANT
PACK MINOR (CUSTOM PROCEDURE TRAY) ×3 IMPLANT
PAD ARMBOARD 7.5X6 YLW CONV (MISCELLANEOUS) ×3 IMPLANT
SET BASIN LINEN APH (SET/KITS/TRAYS/PACK) ×3 IMPLANT
STAPLER VISISTAT (STAPLE) IMPLANT
SUT VIC AB 3-0 SH 27 (SUTURE)
SUT VIC AB 3-0 SH 27X BRD (SUTURE) IMPLANT
SUT VIC AB 4-0 PS2 27 (SUTURE) IMPLANT
TAPE MEDIFIX FOAM 3 (GAUZE/BANDAGES/DRESSINGS) ×3 IMPLANT
TUBE ANAEROBIC PORT A CUL  W/M (MISCELLANEOUS) IMPLANT

## 2016-11-06 NOTE — Anesthesia Postprocedure Evaluation (Signed)
Anesthesia Post Note  Patient: Kaitlyn MortonJulie B Dougherty  Procedure(s) Performed: Procedure(s) (LRB): IRRIGATION AND DEBRIDEMENT THIGH WOUND (Right)  Patient location during evaluation: PACU Anesthesia Type: General Level of consciousness: awake and alert Pain management: satisfactory to patient Vital Signs Assessment: post-procedure vital signs reviewed and stable Respiratory status: spontaneous breathing Cardiovascular status: stable Anesthetic complications: no     Last Vitals:  Vitals:   11/06/16 0715 11/06/16 0720  BP: (!) 101/40 (!) 100/52  Pulse:    Resp: 20 19  Temp:      Last Pain:  Vitals:   11/06/16 0651  TempSrc: Oral  PainSc: 4                  Brydan Downard

## 2016-11-06 NOTE — Anesthesia Procedure Notes (Addendum)
Procedure Name: LMA Insertion Date/Time: 11/06/2016 7:37 AM Performed by: Franco NonesYATES, Cortnie Ringel S Pre-anesthesia Checklist: Patient identified, Patient being monitored, Emergency Drugs available, Timeout performed and Suction available Patient Re-evaluated:Patient Re-evaluated prior to inductionOxygen Delivery Method: Circle System Utilized Preoxygenation: Pre-oxygenation with 100% oxygen Intubation Type: IV induction Ventilation: Mask ventilation without difficulty LMA: LMA inserted LMA Size: 4.0 Number of attempts: 1 Placement Confirmation: positive ETCO2 and breath sounds checked- equal and bilateral Tube secured with: Tape (Paper)

## 2016-11-06 NOTE — Progress Notes (Signed)
PROGRESS NOTE    Kaitlyn MortonJulie B Dougherty  ZOX:[id number] DOB: 08/31/1961 DOA: 11/01/2016 PCP: Selinda FlavinHOWARD, KEVIN, MD     Brief Narrative:  Patient is a 56 year old woman admitted to the hospital from home on 2/16 with an abscess of her right upper thigh. Has a history of type 2 diabetes. Initially started on IV clindamycin, however with worsening of symptoms she was transitioned over to broad-spectrum vancomycin and Zosyn and consultation with Dr. Lovell SheehanJenkins was obtained. Patient was taken to the OR for debridement on 2/21. Wound cultures are pending.   Assessment & Plan:   Principal Problem:   Cellulitis and abscess of right lower extremity Active Problems:   Type 2 diabetes mellitus (HCC)   Hyperlipidemia   Essential hypertension   Hyponatremia   Anemia   Right thigh abscess with skin necrosis -tatus post OR debridement by Dr. Lovell SheehanJenkins on 2/21. -Plan to continue broad-spectrum antibiotics, vancomycin and Zosyn, today pending wound culture data, given severity of abscess as described by Dr. Lovell SheehanJenkins.  Type 2 diabetes -Remains uncontrolled with CBGs above 200. -Will start Lantus 5 units at bedtime in addition to sliding scale.  Hypertension -BP low-normal.  Hyperlipidemia -Continue statin  Hyponatremia -Still mildly hyponatremic at 131. -Believed in part due to diuretic use, they remain on hold.  Anemia -Normocytic, hemoglobin remains around the 9.5 range. Plans for transfusion at present.   DVT prophylaxis: Subcutaneous heparin  Code Status: Full code  Family Communication: Patient only  Disposition Plan: Hope for discharge home over the next 24-72 hours  Consultants:  Surgery, Dr. Lovell SheehanJenkins  Procedures:  Right thigh abscess debridement  Antimicrobials:  Anti-infectives    Start     Dose/Rate Route Frequency Ordered Stop   11/05/16 0600  vancomycin (VANCOCIN) IVPB 1000 mg/200 mL premix     1,000 mg 200 mL/hr over 60 Minutes Intravenous Every 12 hours 11/04/16 1250     11/04/16  1230  vancomycin (VANCOCIN) 1,500 mg in sodium chloride 0.9 % 500 mL IVPB     1,500 mg 250 mL/hr over 120 Minutes Intravenous  Once 11/04/16 1140 11/04/16 1530   11/04/16 1200  piperacillin-tazobactam (ZOSYN) IVPB 3.375 g     3.375 g 12.5 mL/hr over 240 Minutes Intravenous Every 8 hours 11/04/16 1139     11/02/16 0600  clindamycin (CLEOCIN) IVPB 900 mg  Status:  Discontinued     900 mg 100 mL/hr over 30 Minutes Intravenous Every 8 hours 11/02/16 0108 11/04/16 1119   11/01/16 2200  clindamycin (CLEOCIN) IVPB 900 mg     900 mg 100 mL/hr over 30 Minutes Intravenous  Once 11/01/16 2158 11/01/16 2244       Subjective: Still feels a little groggy status post anesthesia  Objective: Vitals:   11/06/16 0915 11/06/16 0920 11/06/16 1020 11/06/16 1445  BP: 127/78 (!) 93/49 (!) 101/48 (!) 100/53  Pulse: 89  (!) 55 73  Resp: (!) 21  20 18   Temp:  98 F (36.7 C) 97.7 F (36.5 C) 97.7 F (36.5 C)  TempSrc:  Oral Oral Oral  SpO2: 94%  100% 94%  Weight:      Height:        Intake/Output Summary (Last 24 hours) at 11/06/16 1502 Last data filed at 11/06/16 1121  Gross per 24 hour  Intake              640 ml  Output             1300 ml  Net             -  660 ml   Filed Weights   11/01/16 1859 11/02/16 0135 11/06/16 0651  Weight: 90.7 kg (200 lb) 91.4 kg (201 lb 8 oz) 91.2 kg (201 lb)    Examination:   General exam: Alert, awake, oriented x 3 Respiratory system: Clear to auscultation. Respiratory effort normal. Cardiovascular system:RRR. No murmurs, rubs, gallops. Gastrointestinal system: Abdomen is nondistended, soft and nontender. No organomegaly or masses felt. Normal bowel sounds heard. Central nervous system: Alert and oriented. No focal neurological deficits. Extremities: No C/C/E, +pedal pulses Skin: No rashes, lesions or ulcers Psychiatry: Judgement and insight appear normal. Mood & affect appropriate.     Data Reviewed: I have personally reviewed following labs and  imaging studies  CBC:  Recent Labs Lab 11/01/16 1926 11/02/16 0607 11/03/16 0619 11/04/16 0601 11/05/16 1302  WBC 9.6 9.2 8.1 8.3 7.1  NEUTROABS 7.7 7.4 6.7  --   --   HGB 10.2* 9.6* 9.4* 9.2* 9.8*  HCT 31.1* 29.4* 29.2* 27.7* 30.1*  MCV 87.6 88.8 90.1 89.4 89.3  PLT 275 269 282 287 348   Basic Metabolic Panel:  Recent Labs Lab 11/02/16 0607 11/03/16 0619 11/04/16 0601 11/05/16 0457 11/06/16 0554  NA 131* 134* 131* 131* 131*  K 3.6 2.9* 5.2* 4.7 4.9  CL 98* 103 104 98* 96*  CO2 21* 24 20* 25 26  GLUCOSE 150* 58* 84 157* 184*  BUN 12 14 16 11 15   CREATININE 0.70 0.81 0.65 0.62 0.62  CALCIUM 8.5* 8.2* 8.0* 8.7* 8.8*  MG  --  1.8 1.7  --   --    GFR: Estimated Creatinine Clearance: 90.4 mL/min (by C-G formula based on SCr of 0.62 mg/dL). Liver Function Tests:  Recent Labs Lab 11/02/16 0607  AST 27  ALT 34  ALKPHOS 123  BILITOT 0.8  PROT 6.5  ALBUMIN 2.7*   No results for input(s): LIPASE, AMYLASE in the last 168 hours. No results for input(s): AMMONIA in the last 168 hours. Coagulation Profile:  Recent Labs Lab 11/03/16 0619  INR 1.09   Cardiac Enzymes: No results for input(s): CKTOTAL, CKMB, CKMBINDEX, TROPONINI in the last 168 hours. BNP (last 3 results) No results for input(s): PROBNP in the last 8760 hours. HbA1C: No results for input(s): HGBA1C in the last 72 hours. CBG:  Recent Labs Lab 11/05/16 2048 11/06/16 0156 11/06/16 0647 11/06/16 0826 11/06/16 1116  GLUCAP 320* 213* 231* 200* 208*   Lipid Profile: No results for input(s): CHOL, HDL, LDLCALC, TRIG, CHOLHDL, LDLDIRECT in the last 72 hours. Thyroid Function Tests: No results for input(s): TSH, T4TOTAL, FREET4, T3FREE, THYROIDAB in the last 72 hours. Anemia Panel: No results for input(s): VITAMINB12, FOLATE, FERRITIN, TIBC, IRON, RETICCTPCT in the last 72 hours. Urine analysis:    Component Value Date/Time   COLORURINE YELLOW 12/27/2008 1347   APPEARANCEUR CLEAR 12/27/2008  1347   LABSPEC 1.025 12/27/2008 1347   PHURINE 5.5 12/27/2008 1347   GLUCOSEU NEGATIVE 12/27/2008 1347   HGBUR LARGE (A) 12/27/2008 1347   BILIRUBINUR NEGATIVE 12/27/2008 1347   KETONESUR NEGATIVE 12/27/2008 1347   PROTEINUR NEGATIVE 12/27/2008 1347   UROBILINOGEN 0.2 12/27/2008 1347   NITRITE NEGATIVE 12/27/2008 1347   LEUKOCYTESUR NEGATIVE 12/27/2008 1347   Sepsis Labs: @LABRCNTIP (procalcitonin:4,lacticidven:4)  ) Recent Results (from the past 240 hour(s))  Culture, blood (routine x 2)     Status: None   Collection Time: 11/01/16  7:57 PM  Result Value Ref Range Status   Specimen Description BLOOD RIGHT HAND  Final   Special  Requests BOTTLES DRAWN AEROBIC AND ANAEROBIC 5CC EACH  Final   Culture NO GROWTH 5 DAYS  Final   Report Status 11/06/2016 FINAL  Final  Culture, blood (routine x 2)     Status: None   Collection Time: 11/01/16  8:00 PM  Result Value Ref Range Status   Specimen Description BLOOD LEFT HAND  Final   Special Requests BOTTLES DRAWN AEROBIC AND ANAEROBIC 6CC EACH  Final   Culture NO GROWTH 5 DAYS  Final   Report Status 11/06/2016 FINAL  Final  MRSA PCR Screening     Status: None   Collection Time: 11/02/16  1:45 AM  Result Value Ref Range Status   MRSA by PCR NEGATIVE NEGATIVE Final    Comment:        The GeneXpert MRSA Assay (FDA approved for NASAL specimens only), is one component of a comprehensive MRSA colonization surveillance program. It is not intended to diagnose MRSA infection nor to guide or monitor treatment for MRSA infections.   Aerobic/Anaerobic Culture (surgical/deep wound)     Status: None (Preliminary result)   Collection Time: 11/02/16 10:54 AM  Result Value Ref Range Status   Specimen Description THIGH  Final   Special Requests NONE  Final   Gram Stain   Final    FEW WBC PRESENT, PREDOMINANTLY PMN MODERATE GRAM POSITIVE COCCI IN CLUSTERS Performed at [facility], [address], [place], [place] [address]    Culture    Final    NORMAL SKIN FLORA NO ANAEROBES ISOLATED; CULTURE IN PROGRESS FOR 5 DAYS    Report Status PENDING  Incomplete  Surgical pcr screen     Status: None   Collection Time: 11/05/16  3:00 PM  Result Value Ref Range Status   MRSA, PCR NEGATIVE NEGATIVE Final   Staphylococcus aureus NEGATIVE NEGATIVE Final    Comment:        The Xpert SA Assay (FDA approved for NASAL specimens in patients over 35 years of age), is one component of a comprehensive surveillance program.  Test performance has been validated by [facility] for patients greater than or equal to 49 year old. It is not intended to diagnose infection nor to guide or monitor treatment.          Radiology Studies: No results found.      Scheduled Meds: . aspirin EC  81 mg Oral Daily  . atorvastatin  20 mg Oral Daily  . feeding supplement (PRO-STAT SUGAR FREE 64)  30 mL Oral BID  . gabapentin  400 mg Oral QPM  . heparin  5,000 Units Subcutaneous Q8H  . insulin aspart  0-9 Units Subcutaneous TID WC  . lisinopril  20 mg Oral Daily  . oxyCODONE-acetaminophen  2 tablet Oral Q6H  . pantoprazole  40 mg Oral Daily  . piperacillin-tazobactam (ZOSYN)  IV  3.375 g Intravenous Q8H  . senna-docusate  1 tablet Oral QHS  . vancomycin  1,000 mg Intravenous Q12H   Continuous Infusions:   LOS: 5 days    Time spent: 25 minutes. Greater than 50% of this time was spent in direct contact with the patient coordinating care.     Chaya Jan, MD [facility] Pager [phone]  If 7PM-7AM, please contact night-coverage [email or web address] Password [email or web address] 11/06/2016, 3:02 PM

## 2016-11-06 NOTE — Addendum Note (Signed)
Addendum  created 11/06/16 [id number] by Franco Noneseresa S Hero Mccathern, CRNA   Anesthesia Review and Sign - Ready for Procedure

## 2016-11-06 NOTE — Progress Notes (Addendum)
Inpatient Diabetes Program Recommendations  AACE/ADA: New Consensus Statement on Inpatient Glycemic Control (2015)  Target Ranges:  Prepandial:   less than 140 mg/dL      Peak postprandial:   less than 180 mg/dL (1-2 hours)      Critically ill patients:  140 - 180 mg/dL   Results for Kaitlyn Dougherty, Kaitlyn Dougherty (MRN [id number]) as of 11/06/2016 13:11  Ref. Range 11/05/2016 17:06 11/05/2016 20:48 11/06/2016 01:56 11/06/2016 06:47 11/06/2016 08:26 11/06/2016 11:16  Glucose-Capillary Latest Ref Range: 65 - 99 mg/dL 782227 (H) 956320 (H) 213213 (H) 231 (H) 200 (H) 208 (H)   Review of Glycemic Control  Current orders for Inpatient glycemic control: Novolog 0-9 units TID with meals  Inpatient Diabetes Program Recommendations:  Insulin - Basal: Please consider ordering Levemir 5 units QHS. Correction (SSI): Please consider ordering Novolog 0-5 units QHS for bedtime correction scale.  Thanks, Orlando PennerMarie Trinette Vera, RN, MSN, CDE Diabetes Coordinator Inpatient Diabetes Program [phone] (Team Pager from 8am to 5pm)

## 2016-11-06 NOTE — Interval H&P Note (Signed)
History and Physical Interval Note:  11/06/2016 6:58 AM  Kaitlyn MortonJulie B Dougherty  has presented today for surgery, with the diagnosis of abscess of right thigh wound  The various methods of treatment have been discussed with the patient and family. After consideration of risks, benefits and other options for treatment, the patient has consented to  Procedure(s): IRRIGATION AND DEBRIDEMENT THIGH WOUND (Right) as a surgical intervention .  The patient's history has been reviewed, patient examined, no change in status, stable for surgery.  I have reviewed the patient's chart and labs.  Questions were answered to the patient's satisfaction.     Franky MachoJENKINS,Welma Mccombs A

## 2016-11-06 NOTE — H&P (View-Only) (Signed)
  Subjective: No significant change in right thigh pain.  Objective: Vital signs in last 24 hours: Temp:  [98 F (36.7 C)-98.5 F (36.9 C)] 98.5 F (36.9 C) (02/19 0512) Pulse Rate:  [72-92] 92 (02/19 0512) Resp:  [20] 20 (02/19 0512) BP: (107-135)/(35-44) 119/44 (02/19 0512) SpO2:  [97 %-100 %] 98 % (02/19 0512) Last BM Date: 10/31/16  Intake/Output from previous day: 02/18 0701 - 02/19 0700 In: 1332.5 [P.O.:480; I.V.:652.5; IV Piggyback:200] Out: -  Intake/Output this shift: No intake/output data recorded.  General appearance: alert, cooperative and no distress Skin: Soft eschar with skin necrosis noted in right thigh, continues to have serous drainage. The induration seems to have improved.  Lab Results:   Recent Labs  11/03/16 0619 11/04/16 0601  WBC 8.1 8.3  HGB 9.4* 9.2*  HCT 29.2* 27.7*  PLT 282 287   BMET  Recent Labs  11/03/16 0619 11/04/16 0601  NA 134* 131*  K 2.9* 5.2*  CL 103 104  CO2 24 20*  GLUCOSE 58* 84  BUN 14 16  CREATININE 0.81 0.65  CALCIUM 8.2* 8.0*   PT/INR  Recent Labs  11/03/16 0619  LABPROT 14.2  INR 1.09    Studies/Results: No results found.  Anti-infectives: Anti-infectives    Start     Dose/Rate Route Frequency Ordered Stop   11/02/16 0600  clindamycin (CLEOCIN) IVPB 900 mg     900 mg 100 mL/hr over 30 Minutes Intravenous Every 8 hours 11/02/16 0108     11/01/16 2200  clindamycin (CLEOCIN) IVPB 900 mg     900 mg 100 mL/hr over 30 Minutes Intravenous  Once 11/01/16 2158 11/01/16 2244      Assessment/Plan: Impression: Cellulitis and abscess of right upper thigh. Plan: Patient is reconsidering surgical intervention. We will continue Silvadene cream to soften eschar and reassess in the morning.  LOS: 3 days    Chrishawn Kring A 11/04/2016

## 2016-11-06 NOTE — Anesthesia Preprocedure Evaluation (Signed)
Anesthesia Evaluation  Patient identified by MRN, date of birth, ID band Patient awake    Reviewed: Allergy & Precautions, NPO status , Patient's Chart, lab work & pertinent test results  Airway Mallampati: II  TM Distance: >3 FB Neck ROM: Full    Dental  (+) Poor Dentition, Partial Upper, Missing   Pulmonary former smoker,    breath sounds clear to auscultation       Cardiovascular hypertension, Pt. on medications + Peripheral Vascular Disease (iliac stent)   Rhythm:Regular Rate:Normal     Neuro/Psych  Neuromuscular disease (peripheral neuropathy)    GI/Hepatic negative GI ROS,   Endo/Other  diabetes, Type 2  Renal/GU      Musculoskeletal   Abdominal   Peds  Hematology  (+) anemia ,   Anesthesia Other Findings   Reproductive/Obstetrics                             Anesthesia Physical Anesthesia Plan  ASA: III  Anesthesia Plan: General   Post-op Pain Management:    Induction: Intravenous  Airway Management Planned: LMA  Additional Equipment:   Intra-op Plan:   Post-operative Plan: Extubation in OR  Informed Consent: I have reviewed the patients History and Physical, chart, labs and discussed the procedure including the risks, benefits and alternatives for the proposed anesthesia with the patient or authorized representative who has indicated his/her understanding and acceptance.     Plan Discussed with:   Anesthesia Plan Comments:         Anesthesia Quick Evaluation

## 2016-11-06 NOTE — Transfer of Care (Signed)
Immediate Anesthesia Transfer of Care Note  Patient: Kaitlyn Dougherty  Procedure(s) Performed: Procedure(s): IRRIGATION AND DEBRIDEMENT THIGH WOUND (Right)  Patient Location: PACU  Anesthesia Type:General  Level of Consciousness: awake and patient cooperative  Airway & Oxygen Therapy: Patient Spontanous Breathing  Post-op Assessment: Report given to RN and Post -op Vital signs reviewed and stable  Post vital signs: Reviewed  Last Vitals:  Vitals:   11/06/16 0715 11/06/16 0720  BP: (!) 101/40 (!) 100/52  Pulse:    Resp: 20 19  Temp:      Last Pain:  Vitals:   11/06/16 0651  TempSrc: Oral  PainSc: 4       Patients Stated Pain Goal: 6 (11/06/16 [id number])  Complications: No apparent anesthesia complications

## 2016-11-06 NOTE — Op Note (Signed)
Patient:  Kaitlyn MortonJulie B Dougherty  DOB:  09/09/1961  MRN:  [id number]   Preop Diagnosis:  Abscess with skin necrosis, right thigh  Postop Diagnosis:  Same  Procedure:  Debridement of right thigh wound, 6 x 8 cm in size  Surgeon:  Franky MachoMark Yecenia Dalgleish, M.D.  Anes:  Gen.  Indications:  Patient is a 56 year old diabetic white female who was hospitalized for cellulitis and abscess in the right upper inner thigh. She now presents to the operating room for debridement. The risks and benefits of the procedure were fully explained to the patient, who gave informed consent.  Procedure note:  The patient was placed in supine position. After general anesthesia was administered, the right thigh wound was prepped and draped using the usual sterile technique with Betadine. Surgical site confirmation was performed.  Necrotic skin which measured 6 x 8 cm in size was noted in the right upper, inner thigh. This eschar was excised using scissors without difficulty. Pockets of necrotic subcutaneous tissue was sharply debrided using a curet and scissors until healthy adipose tissue was seen. The wound was irrigated normal saline. This did not extend down to the muscle. The wound was packed with Betadine impregnated gauze. A dry sterile dressing was then applied.  All tape and needle counts were correct at the end of the procedure. The patient was awakened and transferred to PACU in stable condition.  Complications:  None  EBL:  Minimal  Specimen:  None

## 2016-11-07 ENCOUNTER — Encounter (HOSPITAL_COMMUNITY): Payer: Self-pay | Admitting: General Surgery

## 2016-11-07 LAB — BASIC METABOLIC PANEL
ANION GAP: 9 (ref 5–15)
BUN: 17 mg/dL (ref 6–20)
CO2: 26 mmol/L (ref 22–32)
Calcium: 8.9 mg/dL (ref 8.9–10.3)
Chloride: 96 mmol/L — ABNORMAL LOW (ref 101–111)
Creatinine, Ser: 0.68 mg/dL (ref 0.44–1.00)
GFR calc Af Amer: >60 mL/min (ref >60)
GLUCOSE: 259 mg/dL — AB (ref 65–99)
POTASSIUM: 4.8 mmol/L (ref 3.5–5.1)
Sodium: 131 mmol/L — ABNORMAL LOW (ref 135–145)

## 2016-11-07 LAB — CBC
HEMATOCRIT: 29.2 % — AB (ref 36.0–46.0)
HEMOGLOBIN: 9.4 g/dL — AB (ref 12.0–15.0)
MCH: 29.3 pg (ref 26.0–34.0)
MCHC: 32.2 g/dL (ref 30.0–36.0)
MCV: 91 fL (ref 78.0–100.0)
Platelets: 379 10*3/uL (ref 150–400)
RBC: 3.21 MIL/uL — ABNORMAL LOW (ref 3.87–5.11)
RDW: 14.9 % (ref 11.5–15.5)
WBC: 5.6 10*3/uL (ref 4.0–10.5)

## 2016-11-07 LAB — AEROBIC/ANAEROBIC CULTURE W GRAM STAIN (SURGICAL/DEEP WOUND)

## 2016-11-07 LAB — GLUCOSE, CAPILLARY
GLUCOSE-CAPILLARY: 217 mg/dL — AB (ref 65–99)
Glucose-Capillary: 295 mg/dL — ABNORMAL HIGH (ref 65–99)

## 2016-11-07 LAB — AEROBIC/ANAEROBIC CULTURE (SURGICAL/DEEP WOUND): CULTURE: NORMAL

## 2016-11-07 LAB — VANCOMYCIN, TROUGH: Vancomycin Tr: 15 ug/mL (ref 15–20)

## 2016-11-07 MED ORDER — DOXYCYCLINE HYCLATE 100 MG PO CAPS: 100.0000 mg | ORAL_CAPSULE | Freq: Two times a day (BID) | ORAL | 0 refills | Status: DC

## 2016-11-07 NOTE — Progress Notes (Signed)
Inpatient Diabetes Program Recommendations  AACE/ADA: New Consensus Statement on Inpatient Glycemic Control (2015)  Target Ranges:  Prepandial:   less than 140 mg/dL      Peak postprandial:   less than 180 mg/dL (1-2 hours)      Critically ill patients:  140 - 180 mg/dL   Results for Kaitlyn Dougherty, Kaitlyn Dougherty (MRN [id number]) as of 11/07/2016 09:17  Ref. Range 11/02/2016 06:07  Hemoglobin A1C Latest Ref Range: 4.8 - 5.6 % 6.5 (H)   Results for Kaitlyn Dougherty, Kaitlyn Dougherty (MRN [id number]) as of 11/07/2016 09:17  Ref. Range 11/06/2016 08:26 11/06/2016 11:16 11/06/2016 16:05 11/06/2016 21:22 11/07/2016 07:17  Glucose-Capillary Latest Ref Range: 65 - 99 mg/dL 086200 (H) 578208 (H) 469172 (H) 238 (H) 295 (H)    Home DM Meds: Metformin 1000 mg BID       Glipizide 10 mg daily  Current Insulin Orders: Levemir 5 units QHS      Novolog Sensitive Correction Scale/ SSI (0-9 units) TID AC       MD- Note Levemir 5 units QHS started last PM.  Fasting glucose still elevated today.  Unsure why pt's glucose so elevated this AM (CBG 295 mg/dl).    Patient well controlled at home prior to hospitalization as evidenced by current A1c of 6.5%.  Please consider the following while home oral DM meds are on hold:  1. Increase Levemir to 10 units QHS (0.1 units/kg)  2. Start Novolog Meal Coverage: Novolog 3 units TID with meals (hold if pt eats <50% of meal)    --Will follow patient during hospitalization--  Ambrose FinlandJeannine Johnston Saragrace Selke RN, MSN, CDE Diabetes Coordinator Inpatient Glycemic Control Team Team Pager: [phone] (8a-5p)

## 2016-11-07 NOTE — Progress Notes (Signed)
Pharmacy AntibViotic Note  Kaitlyn Dougherty is a [age] y.o. female admitted on 11/01/2016 with abscess. [facility]. Vancomycin trough therapeutic at 415mcg/ml. Patient  for I&D and debridement 2/21, still draining. Patient is improving . Plan:  Continue Vancomycin 1000mg  IV q12h Continue Zosyn 3.375gm IV q8h, EID Monitor labs, renal fxn, progress and c/s Deescalate ABX when improved / appropriate.    Height: 5\' 6"  (167.6 cm) Weight: 201 lb (91.2 kg) IBW/kg (Calculated) : 59.3  Temp (24hrs), Avg:98.1 F (36.7 C), Min:97.7 F (36.5 C), Max:98.5 F (36.9 C)   Recent Labs Lab 11/01/16 1926 11/01/16 2214 11/02/16 0607 11/03/16 0619 11/04/16 0601 11/05/16 0457 11/05/16 1302 11/06/16 0554 11/07/16 0505  WBC 9.6  --  9.2 8.1 8.3  --  7.1  --  5.6  CREATININE 0.75  --  0.70 0.81 0.65 0.62  --  0.62 0.68  LATICACIDVEN 0.8 0.7  --   --   --   --   --   --   --   VANCOTROUGH  --   --   --   --   --   --   --   --  15    Estimated Creatinine Clearance: 90.4 mL/min (by C-G formula based on SCr of 0.68 mg/dL).    Allergies  Allergen Reactions  . Adhesive [Tape] Itching and Rash    Please use "paper" tape    Antimicrobials this admission: Vanc 2/19 >>  Zosyn 2/19 >>   Dose adjustments this admission:  Microbiology results:  2/16 BCx: ngtd  2/17 thigh Cx: normal flora  2/20  MRSA PCR: negative  Thank you for allowing pharmacy to be a part of this patient's care.  Elder CyphersLorie Anamari Galeas, BS Pharm D, [place] Clinical Pharmacist Pager [phone] 11/07/2016 10:09 AM

## 2016-11-07 NOTE — Progress Notes (Signed)
1 Day Post-Op  Subjective: Patient is comfortable. Does complain of some burning sensation at incision site.  Objective: Vital signs in last 24 hours: Temp:  [97.7 F (36.5 C)-98.5 F (36.9 C)] 98.1 F (36.7 C) (02/22 0600) Pulse Rate:  [55-89] 78 (02/22 0600) Resp:  [18-21] 18 (02/22 0600) BP: (93-127)/(43-78) 113/52 (02/22 0600) SpO2:  [94 %-100 %] 96 % (02/22 0600) Last BM Date: 11/01/16  Intake/Output from previous day: 02/21 0701 - 02/22 0700 In: 1000 [P.O.:600; I.V.:400] Out: 2900 [Urine:2900] Intake/Output this shift: No intake/output data recorded.  General appearance: alert, cooperative and no distress Skin: Right thigh dressing intact.  Lab Results:   Recent Labs  11/05/16 1302 11/07/16 0505  WBC 7.1 5.6  HGB 9.8* 9.4*  HCT 30.1* 29.2*  PLT 348 379   BMET  Recent Labs  11/06/16 0554 11/07/16 0505  NA 131* 131*  K 4.9 4.8  CL 96* 96*  CO2 26 26  GLUCOSE 184* 259*  BUN 15 17  CREATININE 0.62 0.68  CALCIUM 8.8* 8.9   PT/INR No results for input(s): LABPROT, INR in the last 72 hours.  Studies/Results: No results found.  Anti-infectives: Anti-infectives    Start     Dose/Rate Route Frequency Ordered Stop   11/05/16 0600  vancomycin (VANCOCIN) IVPB 1000 mg/200 mL premix     1,000 mg 200 mL/hr over 60 Minutes Intravenous Every 12 hours 11/04/16 1250     11/04/16 1230  vancomycin (VANCOCIN) 1,500 mg in sodium chloride 0.9 % 500 mL IVPB     1,500 mg 250 mL/hr over 120 Minutes Intravenous  Once 11/04/16 1140 11/04/16 1530   11/04/16 1200  piperacillin-tazobactam (ZOSYN) IVPB 3.375 g     3.375 g 12.5 mL/hr over 240 Minutes Intravenous Every 8 hours 11/04/16 1139     11/02/16 0600  clindamycin (CLEOCIN) IVPB 900 mg  Status:  Discontinued     900 mg 100 mL/hr over 30 Minutes Intravenous Every 8 hours 11/02/16 0108 11/04/16 1119   11/01/16 2200  clindamycin (CLEOCIN) IVPB 900 mg     900 mg 100 mL/hr over 30 Minutes Intravenous  Once 11/01/16 2158  11/01/16 2244      Assessment/Plan: s/p Procedure(s): IRRIGATION AND DEBRIDEMENT THIGH WOUND Impression: Stable, status post debridement of right thigh wound.  Plan: Will switch to wet-to-dry normal saline dressings to right thigh daily. Home health has been consulted. Would discharge patient home on 7-10 days worth of an antibiotic, either Bactrim or Vibramycin. I will see patient in follow-up in 2 weeks in my office.  LOS: 6 days    Althea Backs A 11/07/2016

## 2016-11-07 NOTE — Addendum Note (Signed)
Addendum  created 11/07/16 1624 by Despina Hiddenobert J Kion Huntsberry, CRNA   Sign clinical note

## 2016-11-07 NOTE — Progress Notes (Signed)
Pt's PICC line removed and intact. Pt's IV site clean dry and intact. Discharge instructions including medications, wound care and follow up appointments were reviewed and discussed with patient. All questions were answered and no further questions at this time. Pt verbalized understanding of discharge instructions. Pt in stable condition and in no acute distress at time of discharge. Pt escorted by RN.

## 2016-11-07 NOTE — Care Management Note (Signed)
Case Management Note  Patient Details  Name: Kaitlyn Dougherty MRN: [id number] Date of Birth: 05/08/1961  Subjective/Objective:                  Pt admitted with abscess. She is from home, lives with her husband. She works full time. She has PCP, transportation to appointments and no difficulty affording medications. HH has been ordered but pt is not homebound and [facility] she is not eligible. Pt has found assistance with dressing changes 7 days a week. Pt's RN has provided education on dressing changes.   Action/Plan: Pt discharging home today with self care.   Expected Discharge Date:  11/07/16               Expected Discharge Plan:  Home/Self Care  In-House Referral:  NA  Discharge planning Services  CM Consult  Post Acute Care Choice:  NA Choice offered to:  NA Status of Service:  Completed, signed off  Malcolm MetroChildress, Bora Bost Demske, RN 11/07/2016, 1:59 PM

## 2016-11-07 NOTE — Anesthesia Postprocedure Evaluation (Signed)
Anesthesia Post Note  Patient: Kaitlyn MortonJulie B Dougherty  Procedure(s) Performed: Procedure(s) (LRB): IRRIGATION AND DEBRIDEMENT THIGH WOUND (Right)  Patient location during evaluation: Nursing Unit Anesthesia Type: General Level of consciousness: awake and alert, oriented and patient cooperative Pain management: pain level controlled Vital Signs Assessment: post-procedure vital signs reviewed and stable Respiratory status: spontaneous breathing, nonlabored ventilation and respiratory function stable Cardiovascular status: blood pressure returned to baseline Postop Assessment: no signs of nausea or vomiting Anesthetic complications: no     Last Vitals:  Vitals:   11/07/16 0600 11/07/16 1000  BP: (!) 113/52 102/82  Pulse: 78   Resp: 18   Temp: 36.7 C     Last Pain:  Vitals:   11/07/16 1244  TempSrc:   PainSc: 6                  Bentlie Withem J

## 2016-11-07 NOTE — Discharge Summary (Signed)
Physician Discharge Summary  Kaitlyn Dougherty:[id number] DOB: 01/29/1961 DOA: 11/01/2016  PCP: Selinda FlavinHOWARD, KEVIN, MD  Admit date: 11/01/2016 Discharge date: 11/07/2016  Time spent: 45 minutes  Recommendations for Outpatient Follow-up:  -Will be discharged home today. -HHRN has been arranged for dressing changes. -Will need to follow up with Dr. Lovell SheehanJenkins in 2 weeks per his request.   Discharge Diagnoses:  Principal Problem:   Cellulitis and abscess of right lower extremity Active Problems:   Type 2 diabetes mellitus (HCC)   Hyperlipidemia   Essential hypertension   Hyponatremia   Anemia   Discharge Condition: Stable and improved  Filed Weights   11/01/16 1859 11/02/16 0135 11/06/16 0651  Weight: 90.7 kg (200 lb) 91.4 kg (201 lb 8 oz) 91.2 kg (201 lb)    History of present illness:  As per Dr. Robb Matarrtiz on 2/16: Kaitlyn Dougherty is a 56 y.o. female with medical history significant of type 2 diabetes, diabetic peripheral neuropathy, hyperlipidemia, hypertension, peripheral vascular disease who is coming to the emergency department due to a week history of progressively worse erythema, medial upper right thigh abscess, fever, chills, fatigue which has not responded so far to 3 doses of oral Bactrim that was called in by her PCP, Dr. Dimas AguasHoward, that she started yesterday evening.   Per patient, about a week ago she had a pimple-like lesions in her medial right upper thigh area. She denies any history of trauma and denies any manipulation of this lesion from her part as well. Subsequently, the area became erythematous, but has expanded considerably over the last 2 days. She complains of nausea and an episode of emesis earlier today. She denies renal ER, sore throat, productive cough, chest pain, dyspnea, pitting edema lower extremities, abdominal pain, diarrhea, constipation, dysuria, hematuria or frequency.  ED Course: The patient received IV clindamycin, morphine, Zofran and Toradol in the  emergency department reporting partial relief of her symptoms. Her WBC 9.6, hemoglobin 10.2 g/dL, platelets 811275. Her lactic acid was 0.8, sodium 129, potassium 3.9, chloride 93, bicarbonate 25 mmol/L. BUN was 11, creatinine 0.75 and glucose 182 mg/dL.  Imaging: CT of the right femur with contrast showed 4.5 x 5.4 x 1.9 cm in her tight abscess with pockets of air and fluid noted within. There was no oseous involvement.   Hospital Course:   Right thigh abscess with skin necrosis -Status post OR debridement by Dr. Lovell SheehanJenkins on 2/21. -Plan to DC on 10 days of doxy as recommended by surgery. -Wound care has been arranged for home and will need f/u with Dr. Lovell SheehanJenkins in 2 weeks.  Type 2 diabetes -Fair control. -Continue home regimen.  Hypertension -BP low-normal.  Hyperlipidemia -Continue statin  Anemia -Normocytic, hemoglobin remains around the 9.5 range. Plans for transfusion at present.   Procedures:  I and D right thigh abscess in OR   Consultations:  Surgery, Dr. Lovell SheehanJenkins  Discharge Instructions  Discharge Instructions    Diet - low sodium heart healthy    Complete by:  As directed    Increase activity slowly    Complete by:  As directed      Allergies as of 11/07/2016      Reactions   Adhesive [tape] Itching, Rash   Please use "paper" tape      Medication List    STOP taking these medications   meloxicam 15 MG tablet Commonly known as:  MOBIC   sulfamethoxazole-trimethoprim 800-160 MG tablet Commonly known as:  BACTRIM DS,SEPTRA DS  TAKE these medications   amLODipine 10 MG tablet Commonly known as:  NORVASC Take 10 mg by mouth daily.   aspirin 81 MG EC tablet Take 81 mg by mouth daily.   atorvastatin 20 MG tablet Commonly known as:  LIPITOR Take 20 mg by mouth daily.   doxycycline 100 MG capsule Commonly known as:  VIBRAMYCIN Take 1 capsule (100 mg total) by mouth 2 (two) times daily.   DULoxetine 30 MG capsule Commonly known as:   CYMBALTA Take 30 mg by mouth daily.   gabapentin 400 MG capsule Commonly known as:  NEURONTIN Take 400 mg by mouth 2 (two) times daily.   glipiZIDE 10 MG 24 hr tablet Commonly known as:  GLUCOTROL XL Take 10 mg by mouth every morning.   hydrochlorothiazide 12.5 MG capsule Commonly known as:  MICROZIDE Take 12.5 mg by mouth daily.   KLOR-CON M10 10 MEQ tablet Generic drug:  potassium chloride Take 10 mEq by mouth daily.   lisinopril 20 MG tablet Commonly known as:  PRINIVIL,ZESTRIL Take 20 mg by mouth daily.   metFORMIN 500 MG 24 hr tablet Commonly known as:  GLUCOPHAGE-XR Take 1,000 mg by mouth 2 (two) times daily.   mometasone 0.1 % lotion Commonly known as:  ELOCON Place 2 drops into both ears 2 (two) times daily as needed (for ears).   oxyCODONE-acetaminophen 5-325 MG tablet Commonly known as:  PERCOCET/ROXICET TAKE 1 TABLET BY MOUTH TWO TIMES DAILY AS NEEDED FOR PAIN   pantoprazole 20 MG tablet Commonly known as:  PROTONIX Take 20 mg by mouth daily.   PROAIR HFA 108 (90 Base) MCG/ACT inhaler Generic drug:  albuterol Inhale 2 puffs into the lungs every 6 (six) hours as needed for wheezing or shortness of breath.   traMADol 50 MG tablet Commonly known as:  ULTRAM Take 100 mg by mouth 3 (three) times daily.      Allergies  Allergen Reactions  . Adhesive [Tape] Itching and Rash    Please use "paper" tape   Follow-up Information    Dalia Heading, MD. Schedule an appointment as soon as possible for a visit on 11/21/2016.   Specialty:  General Surgery Contact information: [address] [place] [address] [phone]            The results of significant diagnostics from this hospitalization (including imaging, microbiology, ancillary and laboratory) are listed below for reference.    Significant Diagnostic Studies: Ct Femur Right W Contrast  Result Date: 11/01/2016 CLINICAL DATA:  Right thigh abscess along the inner thigh.  Fever.  EXAM: CT OF THE LOWER RIGHT EXTREMITY WITH CONTRAST TECHNIQUE: Multidetector CT imaging of the lower right extremity was performed according to the standard protocol following intravenous contrast administration. COMPARISON:  None. CONTRAST:  ISOVUE-300 IOPAMIDOL (ISOVUE-300) INJECTION 61% FINDINGS: Bones/Joint/Cartilage No fracture nor bone destruction of the right hip and visualized right hemipelvis. Slight joint space narrowing of the right hip. No intra-articular loose bodies. Gluteal calcific tendinopathy. The right SI joint is maintained. The iliac bone and right pubic rami are intact. Ligaments Suboptimally assessed by CT. Muscles and Tendons No intramuscular abscess, mass or hemorrhage. Soft tissues There is a 4.2 x 5.4 x 1.9 cm (cc by AP by transverse) abscess with air-fluid noted within, it's upper margin at the right inguinal crease. IMPRESSION: 4.5 x 5.4 x 1.9 cm upper inner right thigh abscess with pockets of air and fluid noted within. No bony involvement. Electronically Signed   By: Rene Kocher.D.  On: 11/01/2016 22:07    Microbiology: Recent Results (from the past 240 hour(s))  Culture, blood (routine x 2)     Status: None   Collection Time: 11/01/16  7:57 PM  Result Value Ref Range Status   Specimen Description BLOOD RIGHT HAND  Final   Special Requests BOTTLES DRAWN AEROBIC AND ANAEROBIC 5CC EACH  Final   Culture NO GROWTH 5 DAYS  Final   Report Status 11/06/2016 FINAL  Final  Culture, blood (routine x 2)     Status: None   Collection Time: 11/01/16  8:00 PM  Result Value Ref Range Status   Specimen Description BLOOD LEFT HAND  Final   Special Requests BOTTLES DRAWN AEROBIC AND ANAEROBIC 6CC EACH  Final   Culture NO GROWTH 5 DAYS  Final   Report Status 11/06/2016 FINAL  Final  MRSA PCR Screening     Status: None   Collection Time: 11/02/16  1:45 AM  Result Value Ref Range Status   MRSA by PCR NEGATIVE NEGATIVE Final    Comment:        The GeneXpert MRSA Assay  (FDA approved for NASAL specimens only), is one component of a comprehensive MRSA colonization surveillance program. It is not intended to diagnose MRSA infection nor to guide or monitor treatment for MRSA infections.   Aerobic/Anaerobic Culture (surgical/deep wound)     Status: None   Collection Time: 11/02/16 10:54 AM  Result Value Ref Range Status   Specimen Description THIGH  Final   Special Requests NONE  Final   Gram Stain   Final    FEW WBC PRESENT, PREDOMINANTLY PMN MODERATE GRAM POSITIVE COCCI IN CLUSTERS    Culture   Final    NORMAL SKIN FLORA NO ANAEROBES ISOLATED Performed at [facility], [address], [place], [place] [address]    Report Status 11/07/2016 FINAL  Final  Surgical pcr screen     Status: None   Collection Time: 11/05/16  3:00 PM  Result Value Ref Range Status   MRSA, PCR NEGATIVE NEGATIVE Final   Staphylococcus aureus NEGATIVE NEGATIVE Final    Comment:        The Xpert SA Assay (FDA approved for NASAL specimens in patients over 77 years of age), is one component of a comprehensive surveillance program.  Test performance has been validated by [facility] for patients greater than or equal to 10 year old. It is not intended to diagnose infection nor to guide or monitor treatment.      Labs: Basic Metabolic Panel:  Recent Labs Lab 11/03/16 0619 11/04/16 0601 11/05/16 0457 11/06/16 0554 11/07/16 0505  NA 134* 131* 131* 131* 131*  K 2.9* 5.2* 4.7 4.9 4.8  CL 103 104 98* 96* 96*  CO2 24 20* 25 26 26   GLUCOSE 58* 84 157* 184* 259*  BUN 14 16 11 15 17   CREATININE 0.81 0.65 0.62 0.62 0.68  CALCIUM 8.2* 8.0* 8.7* 8.8* 8.9  MG 1.8 1.7  --   --   --    Liver Function Tests:  Recent Labs Lab 11/02/16 0607  AST 27  ALT 34  ALKPHOS 123  BILITOT 0.8  PROT 6.5  ALBUMIN 2.7*   No results for input(s): LIPASE, AMYLASE in the last 168 hours. No results for input(s): AMMONIA in the last 168 hours. CBC:  Recent Labs Lab  11/01/16 1926 11/02/16 0607 11/03/16 0619 11/04/16 0601 11/05/16 1302 11/07/16 0505  WBC 9.6 9.2 8.1 8.3 7.1 5.6  NEUTROABS  7.7 7.4 6.7  --   --   --   HGB 10.2* 9.6* 9.4* 9.2* 9.8* 9.4*  HCT 31.1* 29.4* 29.2* 27.7* 30.1* 29.2*  MCV 87.6 88.8 90.1 89.4 89.3 91.0  PLT 275 269 282 287 348 379   Cardiac Enzymes: No results for input(s): CKTOTAL, CKMB, CKMBINDEX, TROPONINI in the last 168 hours. BNP: BNP (last 3 results) No results for input(s): BNP in the last 8760 hours.  ProBNP (last 3 results) No results for input(s): PROBNP in the last 8760 hours.  CBG:  Recent Labs Lab 11/06/16 1116 11/06/16 1605 11/06/16 2122 11/07/16 0717 11/07/16 1127  GLUCAP 208* 172* 238* 295* 217*       Signed:  Chaya Jan  [facility] Pager: [phone] 11/07/2016, 5:30 PM

## 2016-11-26 ENCOUNTER — Ambulatory Visit (INDEPENDENT_AMBULATORY_CARE_PROVIDER_SITE_OTHER): Payer: PRIVATE HEALTH INSURANCE | Admitting: General Surgery

## 2016-11-26 ENCOUNTER — Encounter: Payer: Self-pay | Admitting: General Surgery

## 2016-11-26 VITALS — BP 131/62 | HR 98 | Temp 97.8°F | Resp 20 | Ht 66.0 in | Wt 200.0 lb

## 2016-11-26 DIAGNOSIS — L03119 Cellulitis of unspecified part of limb: Secondary | ICD-10-CM | POA: Diagnosis not present

## 2016-11-26 DIAGNOSIS — L02419 Cutaneous abscess of limb, unspecified: Secondary | ICD-10-CM | POA: Diagnosis not present

## 2016-11-26 NOTE — Progress Notes (Signed)
Subjective:     Kaitlyn Dougherty  Status post incision and drainage of right thigh abscess. Has been doing wet-to-dry dressings to right thigh twice a day. Denies any fever or chills. Objective:    BP 131/62   Pulse 98   Temp 97.8 F (36.6 C)   Resp 20   Ht 5\' 6"  (1.676 m)   Wt 200 lb (90.7 kg)   BMI 32.28 kg/m   General:  alert, cooperative and no distress  Right thigh wound with granulation tissue noted at base. No purulent drainage noted. The edges are not necrotic. Wound still measures 5 x 6 cm in size.     Assessment:    Doing well postoperatively.    Plan:  May decrease to once a day wet-to-dry dressings. I did offer the patient referral to wound care center. She states she is satisfied with her progress and wants to continue wound care at home. This is fine with me. We'll see the patient in 2 weeks for follow-up.

## 2016-12-10 ENCOUNTER — Ambulatory Visit: Payer: PRIVATE HEALTH INSURANCE | Admitting: General Surgery

## 2016-12-31 ENCOUNTER — Encounter: Payer: Self-pay | Admitting: General Surgery

## 2016-12-31 ENCOUNTER — Ambulatory Visit (INDEPENDENT_AMBULATORY_CARE_PROVIDER_SITE_OTHER): Payer: Self-pay | Admitting: General Surgery

## 2016-12-31 VITALS — BP 149/55 | HR 88 | Temp 97.8°F | Resp 18 | Ht 66.0 in | Wt 200.0 lb

## 2016-12-31 DIAGNOSIS — Z09 Encounter for follow-up examination after completed treatment for conditions other than malignant neoplasm: Secondary | ICD-10-CM

## 2016-12-31 NOTE — Progress Notes (Signed)
Subjective:     Kaitlyn Dougherty  Doing well status post incision and debridement of right thigh wound. Has been doing wet-to-dry dressings. No purulent drainage noted. Objective:    BP (!) 149/55   Pulse 88   Temp 97.8 F (36.6 C)   Resp 18   Ht  (1.676 m)   Wt 200 lb (90.7 kg)   BMI 32.28 kg/m   Right thigh wound healing well with granulation tissue at the base. No purulent drainage noted. No tunneling noted.    triple antibiotic ointment applied and wound dressing changed.  Assessment:    Doing well postoperatively.    Plan:   Change to triple antibiotic ointment to wound twice a day. Follow-up in 3 weeks. Patient pleased with results.

## 2016-12-31 NOTE — Patient Instructions (Signed)
Apply triple antibiotic ointment to wound twice a day. Stop wet-to-dry dressings.

## 2017-01-21 ENCOUNTER — Ambulatory Visit: Payer: PRIVATE HEALTH INSURANCE | Admitting: General Surgery

## 2018-02-03 ENCOUNTER — Other Ambulatory Visit: Payer: Self-pay

## 2018-02-03 ENCOUNTER — Emergency Department (HOSPITAL_COMMUNITY)
Admission: EM | Admit: 2018-02-03 | Discharge: 2018-02-03 | Disposition: A | Discharge status: Home / Self Care | Payer: Commercial Managed Care - PPO | Attending: Emergency Medicine | Admitting: Emergency Medicine

## 2018-02-03 ENCOUNTER — Encounter (HOSPITAL_COMMUNITY): Payer: Self-pay | Admitting: Emergency Medicine

## 2018-02-03 DIAGNOSIS — Z5321 Procedure and treatment not carried out due to patient leaving prior to being seen by health care provider: Secondary | ICD-10-CM | POA: Insufficient documentation

## 2018-02-03 DIAGNOSIS — R202 Paresthesia of skin: Secondary | ICD-10-CM | POA: Diagnosis not present

## 2018-02-03 DIAGNOSIS — M79605 Pain in left leg: Secondary | ICD-10-CM | POA: Diagnosis present

## 2018-02-03 LAB — COMPREHENSIVE METABOLIC PANEL
ALK PHOS: 65 U/L (ref 38–126)
ALT: 11 U/L — AB (ref 14–54)
AST: 10 U/L — AB (ref 15–41)
Albumin: 4.2 g/dL (ref 3.5–5.0)
Anion gap: 11 (ref 5–15)
BUN: 21 mg/dL — AB (ref 6–20)
CALCIUM: 9.2 mg/dL (ref 8.9–10.3)
CO2: 27 mmol/L (ref 22–32)
CREATININE: 0.75 mg/dL (ref 0.44–1.00)
Chloride: 93 mmol/L — ABNORMAL LOW (ref 101–111)
GFR calc non Af Amer: >60 mL/min (ref >60)
Glucose, Bld: 167 mg/dL — ABNORMAL HIGH (ref 65–99)
Potassium: 4.2 mmol/L (ref 3.5–5.1)
SODIUM: 131 mmol/L — AB (ref 135–145)
Total Bilirubin: 0.5 mg/dL (ref 0.3–1.2)
Total Protein: 7.7 g/dL (ref 6.5–8.1)

## 2018-02-03 LAB — D-DIMER, QUANTITATIVE: D-Dimer, Quant: 0.32 ug/mL-FEU (ref 0.00–0.50)

## 2018-02-03 LAB — CBC
HCT: 43.1 % (ref 36.0–46.0)
Hemoglobin: 13.7 g/dL (ref 12.0–15.0)
MCH: 28.7 pg (ref 26.0–34.0)
MCHC: 31.8 g/dL (ref 30.0–36.0)
MCV: 90.2 fL (ref 78.0–100.0)
PLATELETS: 276 10*3/uL (ref 150–400)
RBC: 4.78 MIL/uL (ref 3.87–5.11)
RDW: 14.8 % (ref 11.5–15.5)
WBC: 13.3 10*3/uL — ABNORMAL HIGH (ref 4.0–10.5)

## 2018-02-03 NOTE — ED Triage Notes (Signed)
Pt c/o of numbness and pain in left leg starting today.  Pt states " ive had a blockage in this leg before, and this is what it feels like".

## 2018-02-03 NOTE — ED Notes (Signed)
Pt report unhappiness with wait   Reports leaving to registration

## 2018-03-04 ENCOUNTER — Institutional Professional Consult (permissible substitution): Payer: PRIVATE HEALTH INSURANCE | Admitting: Internal Medicine

## 2018-03-10 MED ORDER — TRULICITY 0.75 MG/0.5 ML SUBCUTANEOUS PEN INJECTOR
0 days
Start: 2018-03-10 — End: ?

## 2018-04-03 ENCOUNTER — Encounter: Payer: Self-pay | Admitting: Internal Medicine

## 2018-04-03 ENCOUNTER — Ambulatory Visit (INDEPENDENT_AMBULATORY_CARE_PROVIDER_SITE_OTHER)
Admission: RE | Admit: 2018-04-03 | Discharge: 2018-04-03 | Disposition: A | Discharge status: Home / Self Care | Payer: Commercial Managed Care - PPO | Source: Ambulatory Visit | Attending: Internal Medicine | Admitting: Internal Medicine

## 2018-04-03 ENCOUNTER — Ambulatory Visit: Payer: Commercial Managed Care - PPO | Admitting: Internal Medicine

## 2018-04-03 VITALS — BP 150/70 | HR 88 | Ht 66.0 in | Wt 220.0 lb

## 2018-04-03 DIAGNOSIS — I10 Essential (primary) hypertension: Secondary | ICD-10-CM | POA: Diagnosis not present

## 2018-04-03 DIAGNOSIS — F17210 Nicotine dependence, cigarettes, uncomplicated: Secondary | ICD-10-CM

## 2018-04-03 DIAGNOSIS — R9389 Abnormal findings on diagnostic imaging of other specified body structures: Secondary | ICD-10-CM | POA: Diagnosis not present

## 2018-04-03 NOTE — Patient Instructions (Addendum)
The key is to stop smoking completely before smoking completely stops you!    Schedule PFT's asap and I'll call you the results and send them to Kaitlyn Dougherty   We will need to check your [facility] > no chest xrays on file    If you have new respiratory problem or Kaitlyn Peonrin  directs you, we will repeat your lung function and arrange CT high resolution without contrast    Please remember to go to the  x-ray department downstairs in the basement  for your tests - we will call you with the results when they are available.

## 2018-04-03 NOTE — Progress Notes (Signed)
Kaitlyn Dougherty, female    DOB: August 21, 1961,    MRN: [id number]   Brief patient profile:  79 yowf active smoker works in medical records at [employer] with new migratory arthritis started around summer 2017 much worse with onset of cooler weather in fall of 2018 >  eval by Dr Neita Carp   March 2019 > referred to Rheumatology saw Elpidio Anis with dx RA rx mobic/ prednisone seemed to help but cxr was abn and referred to pulmonary clinic 04/03/2018 by Jonell Cluck for abn cxr   04/03/2018  1st Pulmonary eval / Sherene Sires   Chief Complaint  Patient presents with  . Pulmoary Consult    Referred by Marrianne Mood for eval of abnormal cxr. She states she has occ SOB "when it's hot and I move around alot".   Dyspnea:  Really not limited by breathing Cough: none  SABA use: none     No obvious day to day or daytime variability or assoc excess/ purulent sputum or mucus plugs or hemoptysis or cp or chest tightness, subjective wheeze or overt sinus or hb symptoms.   Sleep: one pillow/ bed is flat without nocturnal  or early am exacerbation  of respiratory  c/o's or need for noct saba. Also denies any obvious fluctuation of symptoms with weather or environmental changes or other aggravating or alleviating factors except as outlined above   No unusual exposure hx or h/o childhood pna/ asthma or knowledge of premature birth.  Current Allergies, Complete Past Medical History, Past Surgical History, Family History, and Social History were reviewed in [employer] record.  ROS  The following are not active complaints unless bolded Hoarseness, sore throat, dysphagia, dental problems, itching, sneezing,  nasal congestion or discharge of excess mucus or purulent secretions, ear ache,   fever, chills, sweats, unintended wt loss or wt gain, classically pleuritic or exertional cp,  orthopnea pnd or arm/hand swelling  or leg swelling, presyncope, palpitations, abdominal pain, anorexia, nausea, vomiting,  diarrhea  or change in bowel habits or change in bladder habits, change in stools or change in urine, dysuria, hematuria,  rash, arthralgias, visual complaints, headache, numbness, weakness or ataxia or problems with walking or coordination,  change in mood or  memory.                Past Medical History:  Diagnosis Date  . Diabetes mellitus without complication (HCC)   . Diabetic peripheral neuropathy (HCC)   . Hyperlipidemia   . Hypertension   . PVD (peripheral vascular disease) (HCC)   . Ulcer of abdomen wall [facility])     Outpatient Medications Prior to Visit  Medication Sig Dispense Refill  . aspirin 81 MG EC tablet Take 81 mg by mouth daily.  3  . atorvastatin (LIPITOR) 20 MG tablet Take 20 mg by mouth daily.    Marland Kitchen glipiZIDE (GLUCOTROL XL) 10 MG 24 hr tablet Take 10 mg by mouth every morning.  3  . hydrochlorothiazide (MICROZIDE) 12.5 MG capsule Take 12.5 mg by mouth daily.  3  . lisinopril (PRINIVIL,ZESTRIL) 20 MG tablet Take 20 mg by mouth daily.     . meloxicam (MOBIC) 7.5 MG tablet Take 7.5 mg by mouth daily.  1  . metFORMIN (GLUCOPHAGE-XR) 500 MG 24 hr tablet Take 1,000 mg by mouth 2 (two) times daily.   3  . mometasone (ELOCON) 0.1 % lotion Place 2 drops into both ears 2 (two) times daily as needed (for ears).  1  . oxyCODONE-acetaminophen (PERCOCET/ROXICET) 5-325 MG tablet TAKE 1 TABLET BY MOUTH TWO TIMES DAILY AS NEEDED FOR PAIN  0  . pantoprazole (PROTONIX) 20 MG tablet Take 20 mg by mouth daily.   3  . predniSONE (DELTASONE) 5 MG tablet Take 1 tablet by mouth 2 (two) times daily.  0  . senna-docusate (SENOKOT-S) 8.6-50 MG tablet Take 2 tablets by mouth daily.    . traMADol (ULTRAM) 50 MG tablet Take 100 mg by mouth 3 (three) times daily.  1  . TRULICITY 0.75 MG/0.5ML SOPN 1 injection wkly  12  . amLODipine (NORVASC) 10 MG tablet Take 10 mg by mouth daily.  1  . doxycycline (VIBRAMYCIN) 100 MG capsule Take 1 capsule (100 mg total) by mouth 2 (two) times daily. 20  capsule 0  . DULoxetine (CYMBALTA) 30 MG capsule Take 30 mg by mouth daily.    Marland Kitchen. gabapentin (NEURONTIN) 400 MG capsule Take 400 mg by mouth 2 (two) times daily.     Marland Kitchen. KLOR-CON M10 10 MEQ tablet Take 10 mEq by mouth daily.  3  . PROAIR HFA 108 (90 Base) MCG/ACT inhaler Inhale 2 puffs into the lungs every 6 (six) hours as needed for wheezing or shortness of breath.   0   No facility-administered medications prior to visit.               Objective:     BP (!) 150/70 (BP Location: Left Arm, Cuff Size: Normal)   Pulse 88   Ht 5\' 6"  (1.676 m)   Wt 220 lb (99.8 kg)   SpO2 96%   BMI 35.51 kg/m   SpO2: 96 %  RA  - note bp 150/70   Wt Readings from Last 3 Encounters:  04/03/18 220 lb (99.8 kg)  02/03/18 210 lb (95.3 kg)  12/31/16 200 lb (90.7 kg)      HEENT: nl dentition, turbinates bilaterally, and oropharynx. Nl external ear canals without cough reflex   NECK :  without JVD/Nodes/TM/ nl carotid upstrokes bilaterally   LUNGS: no acc muscle use,  Nl contour chest which is clear to A and P bilaterally without cough on insp or exp maneuvers   CV:  RRR  no s3 or murmur or increase in P2, and no edema   ABD:  soft and nontender with nl inspiratory excursion in the supine position. No bruits or organomegaly appreciated, bowel sounds nl  MS:  Nl gait/ ext warm without deformities, calf tenderness, cyanosis or clubbing No obvious joint restrictions   SKIN: warm and dry without lesions    NEURO:  alert, approp, nl sensorium with  no motor or cerebellar deficits apparent.    CXR PA and Lateral:   04/03/2018 :    I personally reviewed images and   impression as follows:   Minimal non-specific increase in lung markings         Assessment   Abnormal CXR She has minimal non-specific increase in lung markings s assoc cough or sob but at risk of ILD from smoking, RA and RA drugs (eg mtx ) so needs a baseline set of pft's and if abn then HRCT appropriate - in meantime needs to  work hard on no smoking (see separate a/p)         Cigarette smoker 4-5 min discussion re active cigarette smoking in addition to office E&M  Ask about tobacco use:   Ongoing  Advise quitting   I emphasized that although we never turn away  smokers from the pulmonary clinic, we do ask that they understand that the recommendations that we make  won't work nearly as well in the presence of continued cigarette exposure. In fact, we may very well  reach a point where we can't promise to help the patient if he/she can't quit smoking. (We can and will promise to try to help, we just can't promise what we recommend will really work)  Assess willingness:  Not committed at this point Assist in quit attempt:  Per PCP when ready Arrange follow up:   Follow up per Primary Care planned        Essential hypertension Not optimally controlled on present regimen. I reviewed this with the patient and emphasized importance of follow-up with primary care.     I would add that ACEi use problematic in pt being monitored for complications from smoking or RA lung dz and if cough becomes an issue then consider alternative to acei  X 6 weeks prior to pulmonary w/u      Total time devoted to counseling  > 50 % of initial  office visit:  review case with pt/ discussion of options/alternatives/ personally creating written customized instructions  in presence of pt  then going over those specific  Instructions directly with the pt including how to use all of the meds but in particular covering each new medication in detail and the difference between the maintenance= "automatic" meds and the prns using an action plan format for the latter (If this problem/symptom => do that organization reading Left to right).  Please see AVS from this visit for a full list of these instructions which I personally wrote for this pt and  are unique to this visit.      Sandrea Hughs, MD 04/03/2018

## 2018-04-04 ENCOUNTER — Encounter: Payer: Self-pay | Admitting: Internal Medicine

## 2018-04-04 DIAGNOSIS — F17210 Nicotine dependence, cigarettes, uncomplicated: Secondary | ICD-10-CM | POA: Insufficient documentation

## 2018-04-04 NOTE — Assessment & Plan Note (Signed)
Not optimally controlled on present regimen. I reviewed this with the patient and emphasized importance of follow-up with primary care.     I would add that ACEi use problematic in pt being monitored for complications from smoking or RA lung dz and if cough becomes an issue then consider alternative to acei  X 6 weeks prior to pulmonary w/u      Total time devoted to counseling  > 50 % of initial  office visit:  review case with pt/ discussion of options/alternatives/ personally creating written customized instructions  in presence of pt  then going over those specific  Instructions directly with the pt including how to use all of the meds but in particular covering each new medication in detail and the difference between the maintenance= "automatic" meds and the prns using an action plan format for the latter (If this problem/symptom => do that organization reading Left to right).  Please see AVS from this visit for a full list of these instructions which I personally wrote for this pt and  are unique to this visit.

## 2018-04-04 NOTE — Assessment & Plan Note (Addendum)
She has minimal non-specific increase in lung markings s assoc cough or sob but at risk of ILD from smoking, RA and RA drugs (eg mtx ) so needs a baseline set of pft's and if abn then HRCT appropriate - in meantime needs to work hard on no smoking (see separate a/p)

## 2018-04-04 NOTE — Assessment & Plan Note (Signed)

## 2018-04-06 NOTE — Progress Notes (Signed)
Spoke with pt and notified of results per Dr. Wert. Pt verbalized understanding and denied any questions. 

## 2018-08-25 ENCOUNTER — Encounter: Admit: 2018-08-25 | Discharge: 2018-08-26 | Payer: BLUE CROSS/BLUE SHIELD

## 2018-10-01 MED ORDER — ADALIMUMAB PEN CITRATE FREE 40 MG/0.4 ML
SUBCUTANEOUS | 2 refills | 0 days
Start: 2018-10-01 — End: ?

## 2018-10-01 NOTE — Unmapped (Signed)
Per test claim for HUMIRA CF 40 Mg/0.4 ML PEN at the [facility], patient needs Medication Assistance Program for High Copay. [phone]

## 2018-10-02 NOTE — Unmapped (Signed)
[facility] Medication Referral: Financial Assistance Approved      Medication (Brand/Generic): Humira    Final Test Claim completed with resulted information below:    Patient ABLE to fill at [facility]  Insurance Company:  [facility]  Anticipated Copay: $5  Is anticipated copay with a copay card or grant? Yes    Does patient's insurance plan only allow a 15 day supply for the first 6 fills in the Split Fill Program? No  If yes, inform patient they can request to dis-enroll from the [facility] by calling the patient help desk at N/A.      If the copay is under the $25 defined limit, per policy there will be no further investigation of need for financial assistance at this time unless patient requests. This referral has been communicated to the provider and handed off to the [facility] team for further processing and filling of prescribed medication.   ______________________________________________________________________  Please utilize this referral for viewing purposes as it will serve as the central location for all relevant documentation and updates.

## 2018-10-22 MED FILL — HUMIRA PEN CITRATE FREE 40 MG/0.4 ML: 28 days supply | Qty: 2 | Fill #0 | Status: AC

## 2018-10-22 MED FILL — HUMIRA PEN CITRATE FREE 40 MG/0.4 ML: SUBCUTANEOUS | 28 days supply | Qty: 2 | Fill #0

## 2018-10-22 NOTE — Unmapped (Signed)
[facility]   Patient Onboarding/Medication Counseling    Erin Fry is a 58 y.o. female with RA who I am counseling today on initiation of therapy.    Medication: Humira 40mg /0.68ml    Verified patient's date of birth / HIPAA.      Education Provided: ?    Dose/Administration discussed: Inject the entire contents of 1 pen (40 mg) under the skin every other week. This medication should be taken  without regard to food.  Stressed the importance of taking medication as prescribed and to contact provider if that changes at any time.  Discussed missed dose instructions.    Storage requirements: this medicine should be stored in the refrigerator.     Side effects / precautions discussed: Discussed common side effects, including headache, signs of a common cold, stomach pain, upset stomach, back pain, irritation where the site is given. If patient experiences signs of infection (fever great than 100.4, chills, sputum), signs of UTI (blood in the urine, fever, lower stomach/pelvic pain, signs of high blood pressure (very bad headache, dizziness, passing out, change in eyesight), red scaly patches or pumps that or pus filled, they need to call the doctor.  Patient will receive a drug information handout with shipment.    Handling precautions / disposal reviewed:  Patient will dispose of needles in a sharps container or empty laundry detergent bottle.    Drug Interactions: other medications reviewed and up to date in Epic.  No drug interactions identified.    Comorbidities/Allergies: reviewed and up to date in Epic.    Verified therapy is appropriate and should continue      Delivery Information    Medication Assistance provided: Copay Assistance    Anticipated copay of $5 reviewed with patient. Verified delivery address in Epic.    Scheduled delivery date: 10/23/2018    Medication will be delivered via UPS to the home address in [facility].  This shipment will not require a signature.      Explained the services we provide at [facility] and that each month we would call to set up refills.  Stressed importance of returning phone calls so that we could ensure they receive their medications in time each month.  Informed patient that we should be setting up refills 7-10 days prior to when they will run out of medication.  Informed patient that welcome packet will be sent.      Patient verbalized understanding of the above information as well as how to contact the pharmacy at [phone] option 4 with any questions/concerns.  The pharmacy is open Monday through Friday 8:30am-4:30pm.  A pharmacist is available 24/7 via pager to answer any clinical questions they may have.        Patient Specific Needs      ? Patient has no physical, cognitive, or cultural barriers.    ? Patient prefers to have medications discussed with  Patient     ? Patient is able to read and understand education materials at a high school level or above.    ? Patient's primary language is  English           Lionel Woodberry  Anders Grant  [facility] Specialty Pharmacist

## 2018-11-09 ENCOUNTER — Ambulatory Visit: Admit: 2018-11-09 | Discharge: 2018-11-10

## 2018-11-10 ENCOUNTER — Ambulatory Visit: Admit: 2018-11-10 | Discharge: 2018-11-11

## 2018-11-13 NOTE — Unmapped (Signed)
Unable to reach patient disenrolling.

## 2019-05-21 IMAGING — DX DG CHEST 2V
2 series · 3 of 3 positions shown · non-contrast
Comparison: 07/16/2016.

CLINICAL DATA: New admission.  Smoker.

EXAM:
CHEST - 2 VIEW

[Series 1: chest pa · 0.14mm/px · 2 of 2 slices shown]
[im 1/2]
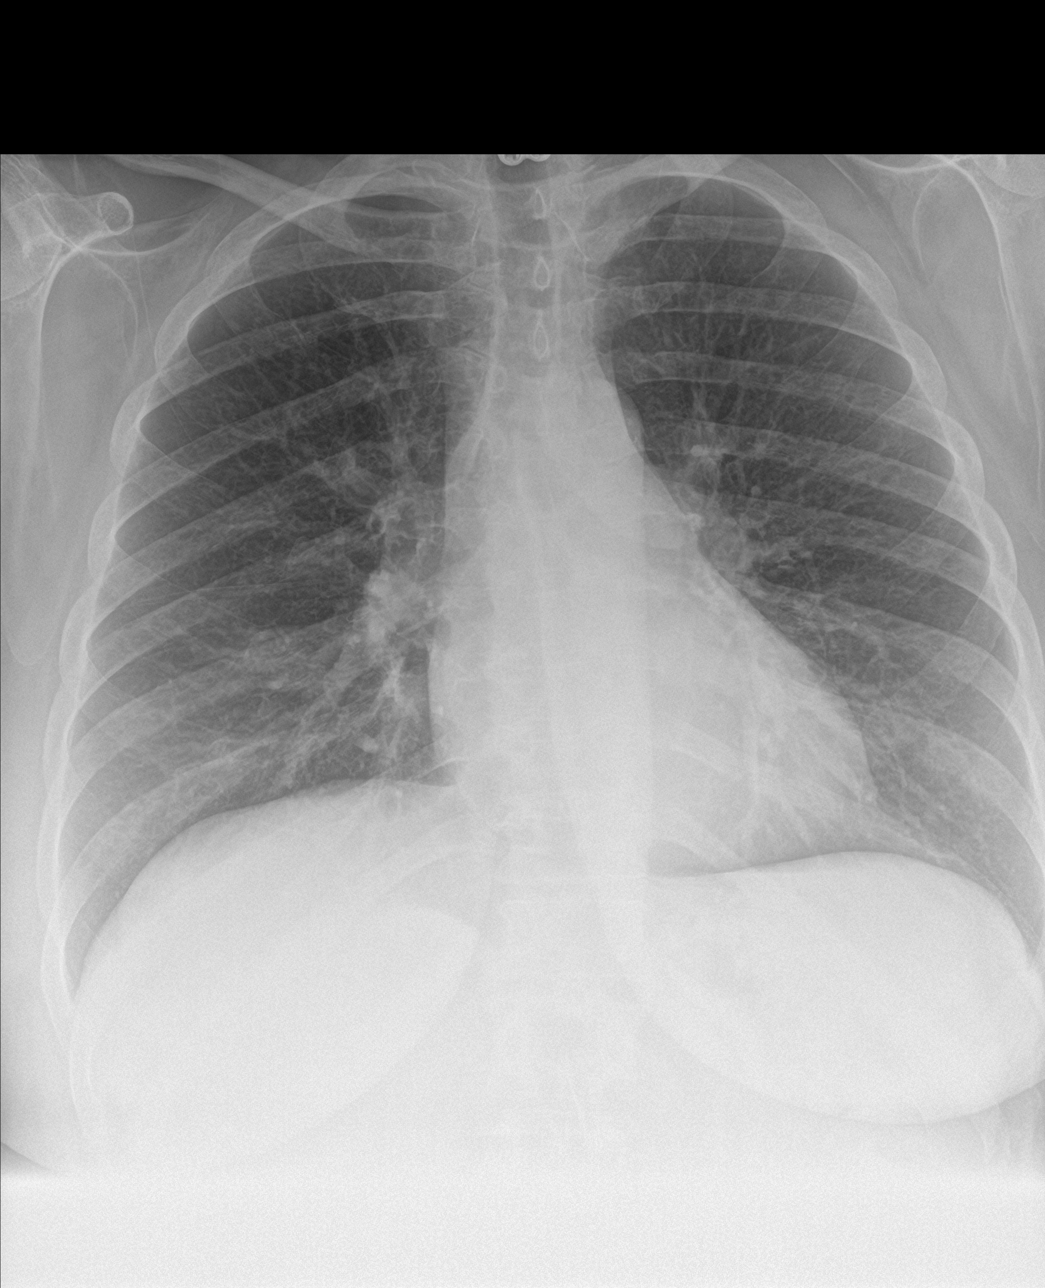
[im 2/2]
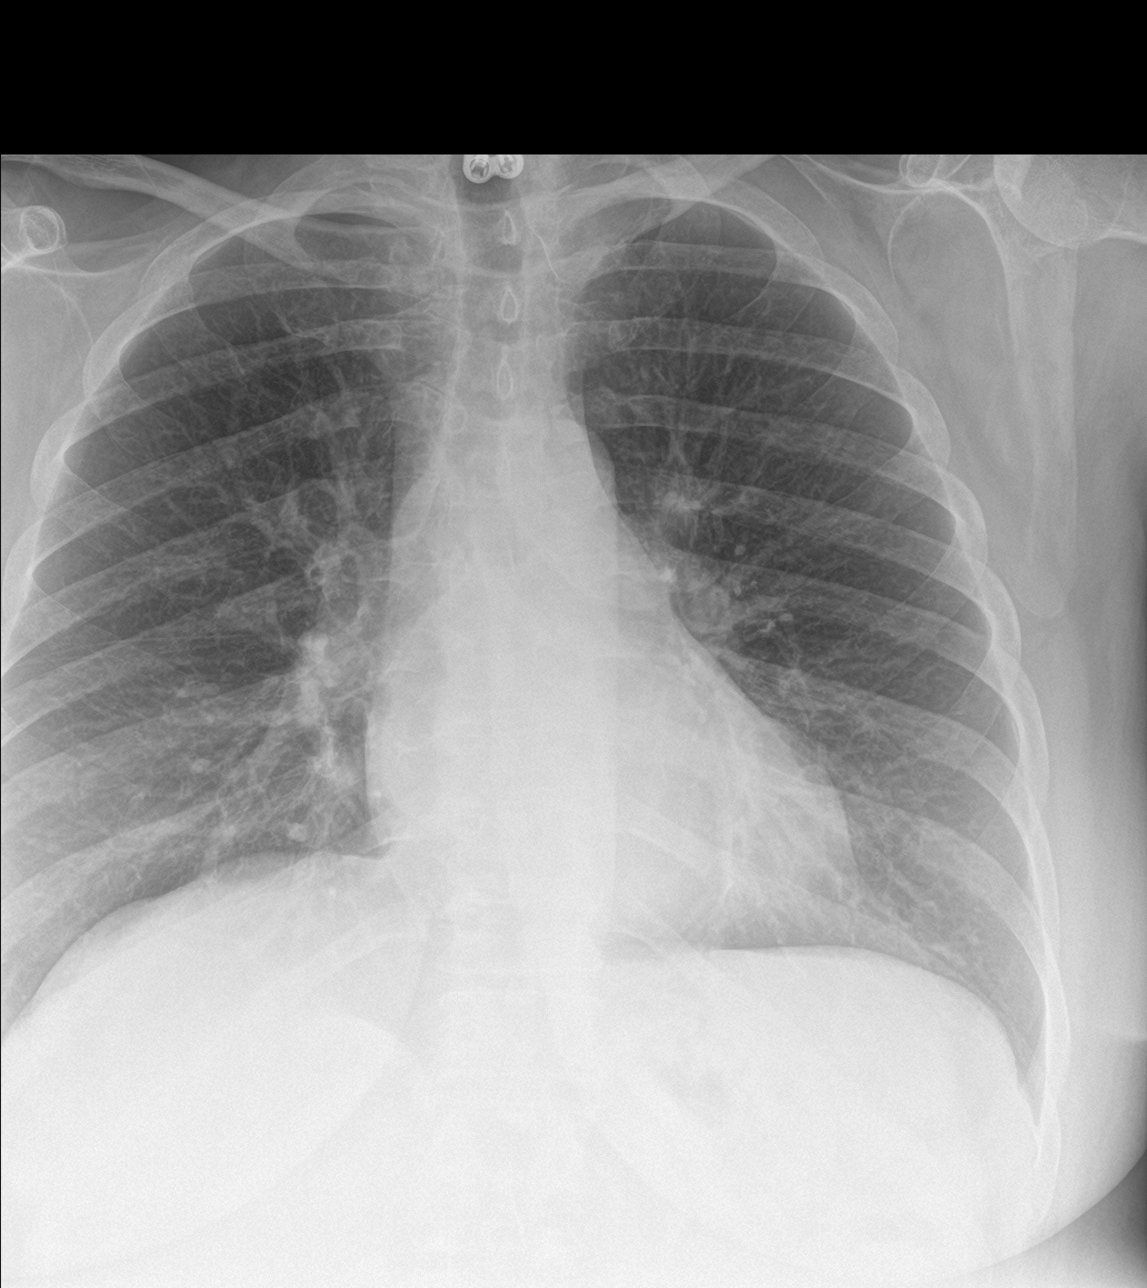

[chest lat]
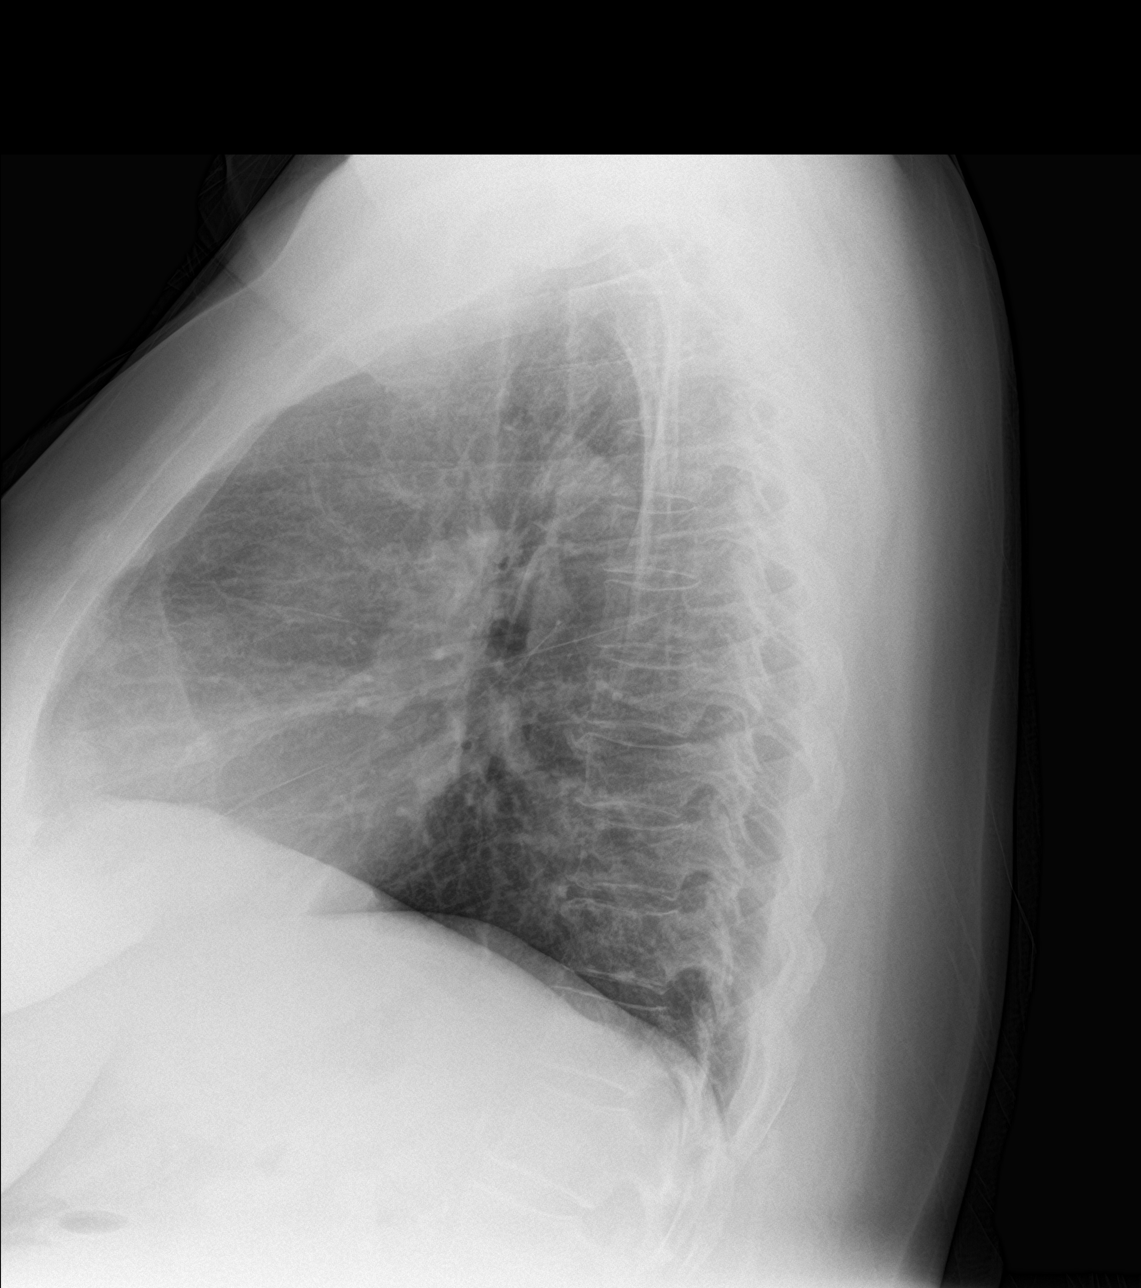

[3 of 3 positions shown; findings below may reference images not displayed]

FINDINGS: Normal sized heart. Clear lungs with mild diffuse peribronchial
thickening. Cervical spine fixation hardware.
IMPRESSION: Mild chronic bronchitic changes.  No acute abnormality.

## 2019-09-03 ENCOUNTER — Encounter: Admit: 2019-09-03 | Discharge: 2019-09-03

## 2019-09-03 ENCOUNTER — Encounter: Admit: 2019-09-03 | Discharge: 2019-09-04 | Payer: BLUE CROSS/BLUE SHIELD | Attending: Family | Primary: Family

## 2019-09-03 DIAGNOSIS — R1110 Vomiting, unspecified: Principal | ICD-10-CM

## 2019-09-08 ENCOUNTER — Encounter: Admit: 2019-09-08 | Discharge: 2019-09-08 | Payer: BLUE CROSS/BLUE SHIELD

## 2019-09-08 ENCOUNTER — Ambulatory Visit: Admit: 2019-09-08 | Discharge: 2019-09-09 | Payer: BLUE CROSS/BLUE SHIELD

## 2019-09-08 DIAGNOSIS — Z9189 Other specified personal risk factors, not elsewhere classified: Principal | ICD-10-CM

## 2020-02-02 ENCOUNTER — Ambulatory Visit: Admit: 2020-02-02 | Discharge: 2020-02-03 | Payer: BLUE CROSS/BLUE SHIELD

## 2020-02-02 DIAGNOSIS — Z1152 Encounter for screening for COVID-19: Principal | ICD-10-CM

## 2020-04-03 ENCOUNTER — Emergency Department (HOSPITAL_COMMUNITY)
Admission: EM | Admit: 2020-04-03 | Discharge: 2020-04-03 | Disposition: A | Discharge status: Home / Self Care | Payer: Commercial Managed Care - PPO | Attending: Emergency Medicine | Admitting: Emergency Medicine

## 2020-04-03 ENCOUNTER — Emergency Department (HOSPITAL_COMMUNITY): Payer: Commercial Managed Care - PPO

## 2020-04-03 ENCOUNTER — Encounter (HOSPITAL_COMMUNITY): Payer: Self-pay | Admitting: *Deleted

## 2020-04-03 DIAGNOSIS — K2970 Gastritis, unspecified, without bleeding: Secondary | ICD-10-CM | POA: Insufficient documentation

## 2020-04-03 DIAGNOSIS — E1140 Type 2 diabetes mellitus with diabetic neuropathy, unspecified: Secondary | ICD-10-CM | POA: Diagnosis not present

## 2020-04-03 DIAGNOSIS — Z7982 Long term (current) use of aspirin: Secondary | ICD-10-CM | POA: Diagnosis not present

## 2020-04-03 DIAGNOSIS — Z7984 Long term (current) use of oral hypoglycemic drugs: Secondary | ICD-10-CM | POA: Diagnosis not present

## 2020-04-03 DIAGNOSIS — K2980 Duodenitis without bleeding: Secondary | ICD-10-CM | POA: Diagnosis not present

## 2020-04-03 DIAGNOSIS — R1013 Epigastric pain: Secondary | ICD-10-CM | POA: Insufficient documentation

## 2020-04-03 DIAGNOSIS — Z79899 Other long term (current) drug therapy: Secondary | ICD-10-CM | POA: Diagnosis not present

## 2020-04-03 DIAGNOSIS — R112 Nausea with vomiting, unspecified: Secondary | ICD-10-CM | POA: Diagnosis present

## 2020-04-03 DIAGNOSIS — F17210 Nicotine dependence, cigarettes, uncomplicated: Secondary | ICD-10-CM | POA: Insufficient documentation

## 2020-04-03 DIAGNOSIS — I10 Essential (primary) hypertension: Secondary | ICD-10-CM | POA: Diagnosis not present

## 2020-04-03 DIAGNOSIS — K2990 Gastroduodenitis, unspecified, without bleeding: Secondary | ICD-10-CM

## 2020-04-03 LAB — CBC WITH DIFFERENTIAL/PLATELET
Abs Immature Granulocytes: 0.01 10*3/uL (ref 0.00–0.07)
Basophils Absolute: 0 10*3/uL (ref 0.0–0.1)
Basophils Relative: 0 %
Eosinophils Absolute: 0.1 10*3/uL (ref 0.0–0.5)
Eosinophils Relative: 1 %
HCT: 41.3 % (ref 36.0–46.0)
Hemoglobin: 12.4 g/dL (ref 12.0–15.0)
Immature Granulocytes: 0 %
Lymphocytes Relative: 40 %
Lymphs Abs: 2 10*3/uL (ref 0.7–4.0)
MCH: 25.1 pg — ABNORMAL LOW (ref 26.0–34.0)
MCHC: 30 g/dL (ref 30.0–36.0)
MCV: 83.6 fL (ref 80.0–100.0)
Monocytes Absolute: 0.4 10*3/uL (ref 0.1–1.0)
Monocytes Relative: 9 %
Neutro Abs: 2.5 10*3/uL (ref 1.7–7.7)
Neutrophils Relative %: 50 %
Platelets: 293 10*3/uL (ref 150–400)
RBC: 4.94 MIL/uL (ref 3.87–5.11)
RDW: 16.1 % — ABNORMAL HIGH (ref 11.5–15.5)
WBC: 5 10*3/uL (ref 4.0–10.5)
nRBC: 0 % (ref 0.0–0.2)

## 2020-04-03 LAB — LIPASE, BLOOD: Lipase: 29 U/L (ref 11–51)

## 2020-04-03 LAB — URINALYSIS, ROUTINE W REFLEX MICROSCOPIC
Bilirubin Urine: NEGATIVE
Glucose, UA: NEGATIVE mg/dL
Hgb urine dipstick: NEGATIVE
Ketones, ur: NEGATIVE mg/dL
Leukocytes,Ua: NEGATIVE
Nitrite: NEGATIVE
Protein, ur: NEGATIVE mg/dL
Specific Gravity, Urine: 1.009 (ref 1.005–1.030)
pH: 6 (ref 5.0–8.0)

## 2020-04-03 LAB — COMPREHENSIVE METABOLIC PANEL
ALT: 9 U/L (ref 0–44)
AST: 12 U/L — ABNORMAL LOW (ref 15–41)
Albumin: 3.7 g/dL (ref 3.5–5.0)
Alkaline Phosphatase: 76 U/L (ref 38–126)
Anion gap: 14 (ref 5–15)
BUN: 13 mg/dL (ref 6–20)
CO2: 25 mmol/L (ref 22–32)
Calcium: 9 mg/dL (ref 8.9–10.3)
Chloride: 91 mmol/L — ABNORMAL LOW (ref 98–111)
Creatinine, Ser: 0.75 mg/dL (ref 0.44–1.00)
GFR calc Af Amer: >60 mL/min (ref >60)
GFR calc non Af Amer: >60 mL/min (ref >60)
Glucose, Bld: 191 mg/dL — ABNORMAL HIGH (ref 70–99)
Potassium: 4 mmol/L (ref 3.5–5.1)
Sodium: 130 mmol/L — ABNORMAL LOW (ref 135–145)
Total Bilirubin: 0.4 mg/dL (ref 0.3–1.2)
Total Protein: 7.6 g/dL (ref 6.5–8.1)

## 2020-04-03 MED ORDER — PANTOPRAZOLE 40 MG TABLET,DELAYED RELEASE
Freq: Every day | ORAL | 0 days
Start: 2020-04-03 — End: ?

## 2020-04-03 MED ORDER — ONDANSETRON 4 MG PO TBDP: 4.0000 mg | ORAL_TABLET | Freq: Three times a day (TID) | ORAL | 0 refills | Status: DC | PRN

## 2020-04-03 MED ORDER — PANTOPRAZOLE SODIUM 40 MG PO TBEC: 40.0000 mg | DELAYED_RELEASE_TABLET | Freq: Every day | ORAL | 0 refills | Status: DC

## 2020-04-03 MED ORDER — ALUM & MAG HYDROXIDE-SIMETH 200-200-20 MG/5ML PO SUSP
30.0000 mL | Freq: Once | ORAL | Status: AC
  Administered 2020-04-03: 30 mL via ORAL
  Filled 2020-04-03: qty 30

## 2020-04-03 MED ORDER — MORPHINE SULFATE (PF) 4 MG/ML IV SOLN
4.0000 mg | Freq: Once | INTRAVENOUS | Status: AC
  Administered 2020-04-03: 4 mg via INTRAVENOUS
  Filled 2020-04-03: qty 1

## 2020-04-03 MED ORDER — SODIUM CHLORIDE 0.9 % IV BOLUS
1000.0000 mL | Freq: Once | INTRAVENOUS | Status: AC
  Administered 2020-04-03: 1000 mL via INTRAVENOUS

## 2020-04-03 MED ORDER — SUCRALFATE 1 GM/10ML PO SUSP: 1.0000 g | Freq: Three times a day (TID) | ORAL | 0 refills | Status: DC

## 2020-04-03 MED ORDER — PANTOPRAZOLE SODIUM 40 MG IV SOLR
40.0000 mg | Freq: Once | INTRAVENOUS | Status: AC
  Administered 2020-04-03: 40 mg via INTRAVENOUS
  Filled 2020-04-03: qty 40

## 2020-04-03 MED ORDER — IOHEXOL 300 MG/ML  SOLN
100.0000 mL | Freq: Once | INTRAMUSCULAR | Status: AC | PRN
  Administered 2020-04-03: 100 mL via INTRAVENOUS

## 2020-04-03 MED ORDER — LIDOCAINE VISCOUS HCL 2 % MT SOLN
15.0000 mL | Freq: Once | OROMUCOSAL | Status: AC
  Administered 2020-04-03: 15 mL via ORAL
  Filled 2020-04-03: qty 15

## 2020-04-03 MED ORDER — ONDANSETRON HCL 4 MG/2ML IJ SOLN
4.0000 mg | Freq: Once | INTRAMUSCULAR | Status: AC
  Administered 2020-04-03: 4 mg via INTRAVENOUS
  Filled 2020-04-03: qty 2

## 2020-04-03 MED ORDER — FAMOTIDINE IN NACL 20-0.9 MG/50ML-% IV SOLN
20.0000 mg | Freq: Once | INTRAVENOUS | Status: AC
  Administered 2020-04-03: 20 mg via INTRAVENOUS
  Filled 2020-04-03: qty 50

## 2020-04-03 NOTE — ED Triage Notes (Signed)
States she has had episodes of nausea and vomiting for months. Most recent 10 days ago, states she has little if any abdominal pain

## 2020-04-03 NOTE — ED Provider Notes (Signed)
[facility] EMERGENCY DEPARTMENT Provider Note   CSN: [id number] Arrival date & time: 04/03/20  1708     History Chief Complaint  Patient presents with  . Emesis    Kaitlyn Dougherty is a 59 y.o. female with a history of T2DM, hypertension, hyperlipidemia, and prior tubal ligation who presents to the emergency department with complaints of N/V w/ abdominal pain x 10 days. Patient states that she has been vomiting daily, multiple times per day, with inability to keep anything down. Having associated upper abdominal pain that is aching in nature, fairly constant, no specific alleviating/aggravating factors. She has had problems with bouts of vomiting like this over the past 6 months but this seems worse. She feels like when she perforated a stomach ulcer a few years ago.  Denies fever, chills, hematemesis, melena, hematochezia, diarrhea, constipation, dysuria, frequency, or urgency.  HPI     Past Medical History:  Diagnosis Date  . Diabetes mellitus without complication (HCC)   . Diabetic peripheral neuropathy (HCC)   . Hyperlipidemia   . Hypertension   . PVD (peripheral vascular disease) (HCC)   . Ulcer of abdomen wall [facility])     Patient Active Problem List   Diagnosis Date Noted  . Cigarette smoker 04/04/2018  . Abnormal CXR 04/03/2018  . Cellulitis and abscess of right lower extremity 11/01/2016  . Type 2 diabetes mellitus (HCC) 11/01/2016  . Hyperlipidemia 11/01/2016  . Essential hypertension 11/01/2016  . Hyponatremia 11/01/2016  . Anemia 11/01/2016    Past Surgical History:  Procedure Laterality Date  . ILIAC ARTERY STENT Bilateral 2006  . INCISION AND DRAINAGE OF WOUND Right 11/06/2016   Procedure: IRRIGATION AND DEBRIDEMENT THIGH WOUND;  Surgeon: Franky Macho, MD;  Location: [facility];  Service: General;  Laterality: Right;  . PERIPHERAL VASCULAR CATHETERIZATION N/A 02/09/2016   Procedure: Abdominal Aortogram w/Lower Extremity;  Surgeon: Sherren Kerns, MD;  [facility] INV[facility] CV LAB;  Service: Cardiovascular;  Laterality: N/A;  . TONSILLECTOMY    . TUBAL LIGATION       OB History   No obstetric history on file.     Family History  Problem Relation Age of Onset  . Hypertension Father     Social History   Tobacco Use  . Smoking status: Current Every Day Smoker    Packs/day: 1.00    Years: 30.00    Pack years: 30.00    Types: Cigarettes  . Smokeless tobacco: Never Used  Substance Use Topics  . Alcohol use: No    Alcohol/week: 0.0 standard drinks  . Drug use: No    Home Medications Prior to Admission medications   Medication Sig Start Date End Date Taking? Authorizing Provider  aspirin 81 MG EC tablet Take 81 mg by mouth daily. 10/01/16   [provider]  atorvastatin (LIPITOR) 20 MG tablet Take 20 mg by mouth daily.    [provider]  glipiZIDE (GLUCOTROL XL) 10 MG 24 hr tablet Take 10 mg by mouth every morning. 11/07/15   [provider]  hydrochlorothiazide (MICROZIDE) 12.5 MG capsule Take 12.5 mg by mouth daily. 09/17/16   [provider]  lisinopril (PRINIVIL,ZESTRIL) 20 MG tablet Take 20 mg by mouth daily.     [provider]  meloxicam (MOBIC) 7.5 MG tablet Take 7.5 mg by mouth daily. 03/17/18   [provider]  metFORMIN (GLUCOPHAGE-XR) 500 MG 24 hr tablet Take 1,000 mg by mouth 2 (two) times daily.  10/26/15   [provider]  mometasone (ELOCON) 0.1 % lotion Place 2 drops into both ears 2 (two) times daily as needed (for ears).  11/15/15   [provider]  oxyCODONE-acetaminophen (PERCOCET/ROXICET) 5-325 MG tablet TAKE 1 TABLET BY MOUTH TWO TIMES DAILY AS NEEDED FOR PAIN 10/04/16   [provider]  pantoprazole (PROTONIX) 20 MG tablet Take 20 mg by mouth daily.  12/27/15   [provider]  predniSONE (DELTASONE) 5 MG tablet Take 1 tablet by mouth 2 (two) times daily. 03/24/18   [provider]  senna-docusate (SENOKOT-S) 8.6-50 MG tablet Take  2 tablets by mouth daily.    [provider]  traMADol (ULTRAM) 50 MG tablet Take 100 mg by mouth 3 (three) times daily. 10/03/16   [provider]  TRULICITY 0.75 MG/0.5ML SOPN 1 injection wkly 03/10/18   [provider]    Allergies    Adhesive [tape]  Review of Systems   Review of Systems  Constitutional: Negative for chills and fever.  Respiratory: Negative for shortness of breath.   Cardiovascular: Negative for chest pain.  Gastrointestinal: Positive for abdominal pain, nausea and vomiting. Negative for anal bleeding, blood in stool, constipation and diarrhea.  Genitourinary: Negative for dysuria, vaginal bleeding and vaginal discharge.  Neurological: Negative for syncope.  All other systems reviewed and are negative.   Physical Exam Updated Vital Signs BP 135/72   Pulse 79   Temp 98.2 F (36.8 C)   Resp 20   Ht 5\' 5"  (1.651 m)   Wt 90.7 kg   SpO2 97%   BMI 33.28 kg/m   Physical Exam Vitals and nursing note reviewed.  Constitutional:      General: She is not in acute distress.    Appearance: She is well-developed. She is not toxic-appearing.  HENT:     Head: Normocephalic and atraumatic.  Eyes:     General:        Right eye: No discharge.        Left eye: No discharge.     Conjunctiva/sclera: Conjunctivae normal.  Cardiovascular:     Rate and Rhythm: Normal rate and regular rhythm.  Pulmonary:     Effort: Pulmonary effort is normal. No respiratory distress.     Breath sounds: Normal breath sounds. No wheezing, rhonchi or rales.  Abdominal:     General: There is no distension.     Palpations: Abdomen is soft.     Tenderness: There is abdominal tenderness in the right upper quadrant, epigastric area and left upper quadrant. There is no guarding or rebound.     Comments: LLQ chronic abdominal wound present.   Musculoskeletal:     Cervical back: Neck supple.     Comments: L axillary drain in place.   Skin:    General: Skin is warm  and dry.     Findings: No rash.  Neurological:     Mental Status: She is alert.     Comments: Clear speech.   Psychiatric:        Behavior: Behavior normal.    ED Results / Procedures / Treatments   Labs (all labs ordered are listed, but only abnormal results are displayed) Labs Reviewed  COMPREHENSIVE METABOLIC PANEL - Abnormal; Notable for the following components:      Result Value   Sodium 130 (*)    Chloride 91 (*)    Glucose, Bld 191 (*)    AST 12 (*)    All other components within normal limits  CBC WITH  DIFFERENTIAL/PLATELET - Abnormal; Notable for the following components:   MCH 25.1 (*)    RDW 16.1 (*)    All other components within normal limits  LIPASE, BLOOD  URINALYSIS, ROUTINE W REFLEX MICROSCOPIC    EKG EKG Interpretation  Date/Time:  Monday April 03 2020 18:32:00 EDT Ventricular Rate:  68 PR Interval:    QRS Duration: 92 QT Interval:  394 QTC Calculation: 419 R Axis:   67 Text Interpretation: Sinus rhythm Atrial premature complex Low voltage, precordial leads Nonspecific T abnormalities, anterior leads Confirmed by Raeford Razor [phone]) on 04/03/2020 9:35:56 PM   Radiology CT Abdomen Pelvis W Contrast  Result Date: 04/03/2020 CLINICAL DATA:  Nausea and vomiting EXAM: CT ABDOMEN AND PELVIS WITH CONTRAST TECHNIQUE: Multidetector CT imaging of the abdomen and pelvis was performed using the standard protocol following bolus administration of intravenous contrast. CONTRAST:  OMNIPAQUE IOHEXOL 300 MG/ML  SOLN COMPARISON:  CT report 09/26/2016 FINDINGS: Lower chest: Lung bases demonstrate no acute consolidation or effusion. Hepatobiliary: Cyst within the right hepatic lobe. Focal hypodensity within the peripheral right hepatic lobe and similar smaller appearing area near the falciform ligament. No calcified gallstone or biliary dilatation Pancreas: No ductal dilatation.  No convincing inflammatory changes. Spleen: Slightly enlarged. Adrenals/Urinary Tract:  Adrenal glands are normal. Prominent renal pelvises bilaterally without definitive hydronephrosis. The bladder is normal Stomach/Bowel: Thick-walled appearance of the pylorus of the stomach. Slightly indistinct appearance of the duodenal bulb with surrounding haziness. No extraluminal gas collections. Remainder of the small bowel is unremarkable. No bowel wall thickening. Negative appendix. Vascular/Lymphatic: Advanced aortic atherosclerosis. No aneurysm. Mildly prominent peripancreatic lymph node measuring up to 1 cm, series 2, image number 23. Reproductive: Uterus and bilateral adnexa are unremarkable. Other: Negative for free air or free fluid. Moderate fat containing ventral hernia. Musculoskeletal: 6 mm anterolisthesis L4 on L5 with degenerative changes. IMPRESSION: 1. Thick-walled appearance of the pylorus of the stomach with slightly indistinct appearance of the duodenal bulb and surrounding haziness, findings could be secondary to an inflammatory process such as gastritis/duodenitis or peptic ulcer disease. No extraluminal gas collections to suggest perforation. Consider correlation with endoscopy. No convincing pancreatic inflammation though could correlate with enzymes given close proximity of the pancreatic head and neck to the mild inflammatory changes of the stomach and duodenum. 2. Mildly prominent peripancreatic lymph nodes, likely reactive. 3. Moderate fat containing ventral hernia. 4. Mild splenomegaly. 5. Probable focal fat infiltration near the falciform ligament. Vague peripheral hypodensity in the right hepatic lobe could represent an area of geographic fat infiltration though nonemergent/outpatient MRI evaluation should be considered. 6. Aortic atherosclerosis. Aortic Atherosclerosis (ICD10-I70.0). Electronically Signed   By: Jasmine Pang M.D.   On: 04/03/2020 20:39    Procedures Procedures (including critical care time)  Medications Ordered in ED Medications - No data to display  ED  Course  I have reviewed the triage vital signs and the nursing notes.  Pertinent labs & imaging results that were available during my care of the patient were reviewed by me and considered in my medical decision making (see chart for details).    MDM Rules/Calculators/A&P                         Patient presents to the ED with complaints of N/V & abdominal pain.  Nontoxic, vitals WNL. Exam with upper abdominal tenderness. I have ordered fluids, zofran, morphine, & pepcid for symptomatic management.   Additional history obtained:  Additional history obtained from  prior visits & nursing note review.   EKG: nonspecific T wave changes, no STEMI.   Lab Tests:  I Ordered, reviewed, and interpreted labs, which included:  CBC: No significant anemia or leukocytosis. CMP: Mild hyponatremia/chloremia, receiving normal saline, hyperglycemia noted without acidosis or anion gap elevation.  Liver function test and renal function preserved. Lipase: Within normal limits UA: No UTI  Imaging Studies ordered:  I ordered imaging studies which included CT A/P, I independently visualized and interpreted imaging which showed 1. Thick-walled appearance of the pylorus of the stomach with slightly indistinct appearance of the duodenal bulb and surrounding haziness, findings could be secondary to an inflammatory process such as gastritis/duodenitis or peptic ulcer disease. No extraluminal gas collections to suggest perforation. Consider correlation with endoscopy. No convincing pancreatic inflammation though could correlate with enzymes given close proximity of the pancreatic head and neck to the mild inflammatory changes of the stomach and duodenum. 2. Mildly prominent peripancreatic lymph nodes, likely reactive. 3. Moderate fat containing ventral hernia. 4. Mild splenomegaly. 5. Probable focal fat infiltration near the falciform ligament. Vague peripheral hypodensity in the right hepatic lobe could represent an area  of geographic fat infiltration though nonemergent/outpatient MRI evaluation should be considered. 6. Aortic atherosclerosis  Findings of gastritis/duodenitis versus peptic ulcer disease, no evidence of perforation.  In regards to pancreatic findings patient's lipase is normal feel that acute pancreatitis is less likely.  Patient is feeling improved status post interventions in the emergency department.  She is tolerating p.o.  She has no reports of blood in her stool, she is not anemic, and her BUN is within normal limits, do not suspect significant upper GI bleed.  Will start on PPI, Carafate, and Zofran with close GI follow-up. I discussed results, treatment plan, need for follow-up, and return precautions with the patient & her daughter @ bedside. Provided opportunity for questions, patient & her daughter confirmed understanding and are in agreement with plan.   Portions of this note were generated with Scientist, clinical (histocompatibility and immunogenetics). Dictation errors may occur despite best attempts at proofreading.  Final Clinical Impression(s) / ED Diagnoses Final diagnoses:  Gastritis and duodenitis    Rx / DC Orders ED Discharge Orders         Ordered    pantoprazole (PROTONIX) 40 MG tablet  Daily     Discontinue  Reprint     04/03/20 2145    sucralfate (CARAFATE) 1 GM/10ML suspension  3 times daily with meals & bedtime     Discontinue  Reprint     04/03/20 2145    ondansetron (ZOFRAN ODT) 4 MG disintegrating tablet  Every 8 hours PRN     Discontinue  Reprint     04/03/20 2145           Cristle Jared, Pleas Koch, PA-C 04/03/20 2147    Raeford Razor, MD 04/08/20 2047

## 2020-04-03 NOTE — Discharge Instructions (Signed)
You were seen in the ER today for abdominal pain with nausea & vomiting. Your CT scan showed findings of gastritis/duodenitis or potentially peptic ulcer disease. For this reason we would like you to avoid any NSAIDs (meloxicam, ibuprofen, Advil, Aleve, Goody powder etc.) and alcohol as this can further irritate this condition we would like you to follow the attached diet guidelines.  We are sending you home with the following medicines to treat this:    We are sending you home with Protonix to take once per day prior to meals as well as Carafate to take 4 times per day (prior to meals and prior to bedtime) to help with stomach acidity and possible reflux.  We are also sending home Zofran to take every 8 hours as needed for nausea and vomiting.  We have prescribed you new medication(s) today. Discuss the medications prescribed today with your pharmacist as they can have adverse effects and interactions with your other medicines including over the counter and prescribed medications. Seek medical evaluation if you start to experience new or abnormal symptoms after taking one of these medicines, seek care immediately if you start to experience difficulty breathing, feeling of your throat closing, facial swelling, or rash as these could be indications of a more serious allergic reaction  Your CT scan and ultrasound findings of fatty infiltrate around your liver, the radiologist recommended to have an outpatient MRI performed, please discuss this with your GI doctor at follow-up.  Please follow-up with GI within 3 days.  Return to the ER for new or worsening symptoms including but not limited to increased pain, inability to keep fluids down, blood in vomit or stool, or any other concerns.

## 2020-04-30 MED ORDER — DULOXETINE 60 MG CAPSULE,DELAYED RELEASE
Freq: Every day | ORAL | 0.00000 days | Status: SS
Start: 2020-04-30 — End: ?

## 2020-05-12 MED ORDER — TRULICITY 0.75 MG/0.5 ML SUBCUTANEOUS PEN INJECTOR
SUBCUTANEOUS | 0.00000 days | Status: SS
Start: 2020-05-12 — End: ?

## 2020-05-12 MED ORDER — AMLODIPINE 10 MG TABLET
Freq: Every day | ORAL | 0 days | Status: SS
Start: 2020-05-12 — End: ?

## 2020-05-12 MED ORDER — GLIPIZIDE ER 10 MG TABLET, EXTENDED RELEASE 24 HR
Freq: Every morning | ORAL | 0.00000 days | Status: SS
Start: 2020-05-12 — End: ?

## 2020-05-18 ENCOUNTER — Encounter
Admit: 2020-05-18 | Discharge: 2020-05-18 | Disposition: A | Discharge status: Home / Self Care | Payer: BLUE CROSS/BLUE SHIELD

## 2020-05-18 DIAGNOSIS — T148XXA Other injury of unspecified body region, initial encounter: Principal | ICD-10-CM

## 2020-05-18 DIAGNOSIS — S31109A Unspecified open wound of abdominal wall, unspecified quadrant without penetration into peritoneal cavity, initial encounter: Principal | ICD-10-CM

## 2020-05-18 DIAGNOSIS — L089 Local infection of the skin and subcutaneous tissue, unspecified: Principal | ICD-10-CM

## 2020-05-18 MED ORDER — CEPHALEXIN 500 MG CAPSULE
ORAL_CAPSULE | Freq: Four times a day (QID) | ORAL | 0 refills | 10.00000 days | Status: CP
Start: 2020-05-18 — End: 2020-05-28

## 2020-05-18 MED ORDER — MUPIROCIN CALCIUM 2 % TOPICAL CREAM
Freq: Three times a day (TID) | TOPICAL | 0 refills | 7.00000 days | Status: CP
Start: 2020-05-18 — End: 2020-05-25

## 2020-05-21 ENCOUNTER — Encounter: Admit: 2020-05-21 | Discharge: 2020-05-22 | Discharge status: Against Medical Advice | Payer: BLUE CROSS/BLUE SHIELD

## 2020-05-21 ENCOUNTER — Ambulatory Visit: Admit: 2020-05-21 | Discharge: 2020-05-22 | Discharge status: Against Medical Advice | Payer: BLUE CROSS/BLUE SHIELD

## 2020-05-25 DIAGNOSIS — S31109A Unspecified open wound of abdominal wall, unspecified quadrant without penetration into peritoneal cavity, initial encounter: Principal | ICD-10-CM

## 2020-05-25 DIAGNOSIS — L089 Local infection of the skin and subcutaneous tissue, unspecified: Principal | ICD-10-CM

## 2020-05-26 ENCOUNTER — Ambulatory Visit
Admit: 2020-05-26 | Discharge: 2020-06-03 | Disposition: A | Discharge status: Other Acute Inpatient Hospital | Payer: BLUE CROSS/BLUE SHIELD

## 2020-05-26 ENCOUNTER — Encounter
Admit: 2020-05-26 | Discharge: 2020-06-03 | Disposition: A | Discharge status: Other Acute Inpatient Hospital | Payer: BLUE CROSS/BLUE SHIELD | Attending: Anesthesiology

## 2020-05-26 ENCOUNTER — Encounter: Admit: 2020-05-26 | Discharge: 2020-06-24 | Payer: BLUE CROSS/BLUE SHIELD

## 2020-05-26 ENCOUNTER — Encounter
Admit: 2020-05-26 | Discharge: 2020-06-03 | Disposition: A | Discharge status: Other Acute Inpatient Hospital | Payer: BLUE CROSS/BLUE SHIELD | Attending: Certified Registered"

## 2020-05-26 DIAGNOSIS — L089 Local infection of the skin and subcutaneous tissue, unspecified: Principal | ICD-10-CM

## 2020-05-26 DIAGNOSIS — L97529 Non-pressure chronic ulcer of other part of left foot with unspecified severity: Principal | ICD-10-CM

## 2020-05-26 DIAGNOSIS — E11621 Type 2 diabetes mellitus with foot ulcer: Secondary | ICD-10-CM

## 2020-05-26 DIAGNOSIS — S31109A Unspecified open wound of abdominal wall, unspecified quadrant without penetration into peritoneal cavity, initial encounter: Principal | ICD-10-CM

## 2020-05-26 MED ORDER — OXYCODONE-ACETAMINOPHEN 10 MG-325 MG TABLET: 1 | Freq: Four times a day (QID) | 0 refills | 0 days | Status: SS

## 2020-06-03 ENCOUNTER — Ambulatory Visit
Admit: 2020-06-03 | Discharge: 2020-06-16 | Disposition: A | Discharge status: Other Acute Inpatient Hospital | Payer: BLUE CROSS/BLUE SHIELD | Source: Other Acute Inpatient Hospital | Admitting: Student in an Organized Health Care Education/Training Program

## 2020-06-03 ENCOUNTER — Encounter
Admit: 2020-06-03 | Discharge: 2020-06-16 | Disposition: A | Discharge status: Other Acute Inpatient Hospital | Payer: BLUE CROSS/BLUE SHIELD | Source: Other Acute Inpatient Hospital | Attending: Anesthesiology | Admitting: Student in an Organized Health Care Education/Training Program

## 2020-06-03 ENCOUNTER — Encounter
Admit: 2020-06-03 | Discharge: 2020-06-16 | Disposition: A | Discharge status: Other Acute Inpatient Hospital | Payer: BLUE CROSS/BLUE SHIELD | Source: Other Acute Inpatient Hospital | Attending: Student in an Organized Health Care Education/Training Program | Admitting: Student in an Organized Health Care Education/Training Program

## 2020-06-12 ENCOUNTER — Encounter: Payer: Commercial Managed Care - PPO | Admitting: Vascular Surgery

## 2020-06-15 MED ORDER — NICOTINE (POLACRILEX) 4 MG GUM
BUCCAL | 0 refills | 5 days | Status: CN | PRN
Start: 2020-06-15 — End: 2020-07-15

## 2020-06-15 MED ORDER — PREGABALIN 75 MG CAPSULE
ORAL_CAPSULE | Freq: Three times a day (TID) | ORAL | 0 refills | 30.00000 days | Status: CN
Start: 2020-06-15 — End: 2020-07-15

## 2020-06-15 MED ORDER — CARVEDILOL 3.125 MG TABLET
ORAL_TABLET | Freq: Two times a day (BID) | ORAL | 0 refills | 30 days | Status: CN
Start: 2020-06-15 — End: 2020-07-15

## 2020-06-15 MED ORDER — OXYCODONE 10 MG TABLET
ORAL_TABLET | ORAL | 0 refills | 2 days | Status: CN | PRN
Start: 2020-06-15 — End: 2020-06-20

## 2020-06-15 MED ORDER — CYCLOBENZAPRINE 10 MG TABLET
Freq: Three times a day (TID) | ORAL | 0 refills | 0.00000 days | Status: CN | PRN
Start: 2020-06-15 — End: ?

## 2020-06-15 MED ORDER — NYSTATIN 100,000 UNIT/GRAM TOPICAL POWDER
Freq: Two times a day (BID) | TOPICAL | 0 refills | 15 days | Status: SS
Start: 2020-06-15 — End: 2021-06-15

## 2020-06-15 MED ORDER — LORAZEPAM 0.5 MG TABLET
ORAL | 0 refills | 0 days | Status: CN | PRN
Start: 2020-06-15 — End: 2020-06-20

## 2020-06-15 MED ORDER — NICOTINE (POLACRILEX) 2 MG BUCCAL LOZENGE
BUCCAL | 0 refills | 5.00000 days | Status: CN | PRN
Start: 2020-06-15 — End: 2020-07-15

## 2020-06-15 MED ORDER — NYSTATIN 100,000 UNIT/GRAM TOPICAL CREAM
Freq: Two times a day (BID) | TOPICAL | 0 refills | 30 days | Status: SS
Start: 2020-06-15 — End: 2021-06-15

## 2020-06-16 ENCOUNTER — Ambulatory Visit
Admit: 2020-06-16 | Discharge: 2020-06-16 | Disposition: A | Discharge status: Skilled Nursing Facility | Payer: BLUE CROSS/BLUE SHIELD | Source: Other Acute Inpatient Hospital

## 2020-06-16 MED ORDER — ASPIRIN 81 MG TABLET,DELAYED RELEASE
ORAL_TABLET | Freq: Every day | ORAL | 0 refills | 30 days | Status: CP
Start: 2020-06-16 — End: 2020-07-16

## 2020-06-16 MED ORDER — INSULIN NPH ISOPHANE U-100 HUMAN 100 UNIT/ML SUBCUTANEOUS SUSPENSION
Freq: Two times a day (BID) | SUBCUTANEOUS | 3 refills | 42.00000 days | Status: CP
Start: 2020-06-16 — End: ?

## 2020-06-16 MED ORDER — INSULIN LISPRO (U-100) 100 UNIT/ML SUBCUTANEOUS SOLUTION
Freq: Three times a day (TID) | SUBCUTANEOUS | 0 refills | 67 days | Status: CP
Start: 2020-06-16 — End: 2020-07-16

## 2020-06-16 MED ORDER — APIXABAN 5 MG TABLET
ORAL_TABLET | Freq: Two times a day (BID) | ORAL | 0 refills | 30 days | Status: CP
Start: 2020-06-16 — End: ?

## 2020-06-16 MED ORDER — PREGABALIN 75 MG CAPSULE
ORAL_CAPSULE | Freq: Three times a day (TID) | ORAL | 0 refills | 30.00000 days | Status: CP
Start: 2020-06-16 — End: 2020-07-16

## 2020-06-16 MED ORDER — ATORVASTATIN 40 MG TABLET
ORAL_TABLET | Freq: Every day | ORAL | 0 refills | 30.00000 days | Status: CN
Start: 2020-06-16 — End: 2020-07-16

## 2020-06-28 ENCOUNTER — Encounter: Admit: 2020-06-28 | Discharge: 2020-06-28 | Payer: BLUE CROSS/BLUE SHIELD

## 2020-06-28 DIAGNOSIS — E871 Hypo-osmolality and hyponatremia: Principal | ICD-10-CM

## 2020-06-28 DIAGNOSIS — E785 Hyperlipidemia, unspecified: Principal | ICD-10-CM

## 2020-07-01 ENCOUNTER — Encounter: Admit: 2020-07-01 | Discharge: 2020-07-01 | Payer: BLUE CROSS/BLUE SHIELD

## 2020-07-01 DIAGNOSIS — Z4781 Encounter for orthopedic aftercare following surgical amputation: Principal | ICD-10-CM

## 2020-07-06 ENCOUNTER — Encounter: Admit: 2020-07-06 | Discharge: 2020-07-06 | Payer: BLUE CROSS/BLUE SHIELD

## 2020-07-12 ENCOUNTER — Ambulatory Visit
Admit: 2020-07-12 | Discharge: 2020-07-13 | Payer: BLUE CROSS/BLUE SHIELD | Attending: Nurse Practitioner | Primary: Nurse Practitioner

## 2020-07-12 ENCOUNTER — Ambulatory Visit: Admit: 2020-07-12 | Discharge: 2020-07-13 | Payer: BLUE CROSS/BLUE SHIELD

## 2020-07-12 DIAGNOSIS — I739 Peripheral vascular disease, unspecified: Principal | ICD-10-CM

## 2020-07-12 DIAGNOSIS — S88119A Complete traumatic amputation at level between knee and ankle, unspecified lower leg, initial encounter: Principal | ICD-10-CM

## 2020-07-14 ENCOUNTER — Encounter: Admit: 2020-07-14 | Discharge: 2020-07-14

## 2020-07-14 DIAGNOSIS — E785 Hyperlipidemia, unspecified: Principal | ICD-10-CM

## 2020-07-14 DIAGNOSIS — E871 Hypo-osmolality and hyponatremia: Principal | ICD-10-CM

## 2020-07-18 ENCOUNTER — Encounter
Admit: 2020-07-18 | Discharge: 2020-07-19 | Payer: BLUE CROSS/BLUE SHIELD | Attending: Student in an Organized Health Care Education/Training Program | Primary: Student in an Organized Health Care Education/Training Program

## 2020-07-18 DIAGNOSIS — E11622 Type 2 diabetes mellitus with other skin ulcer: Principal | ICD-10-CM

## 2020-07-18 DIAGNOSIS — E1122 Type 2 diabetes mellitus with diabetic chronic kidney disease: Principal | ICD-10-CM

## 2020-07-18 DIAGNOSIS — E785 Hyperlipidemia, unspecified: Principal | ICD-10-CM

## 2020-07-18 DIAGNOSIS — L97829 Non-pressure chronic ulcer of other part of left lower leg with unspecified severity: Principal | ICD-10-CM

## 2020-07-18 DIAGNOSIS — Z7982 Long term (current) use of aspirin: Principal | ICD-10-CM

## 2020-07-18 DIAGNOSIS — I5022 Chronic systolic (congestive) heart failure: Principal | ICD-10-CM

## 2020-07-18 DIAGNOSIS — N189 Chronic kidney disease, unspecified: Principal | ICD-10-CM

## 2020-07-18 DIAGNOSIS — E1151 Type 2 diabetes mellitus with diabetic peripheral angiopathy without gangrene: Principal | ICD-10-CM

## 2020-07-18 DIAGNOSIS — Z79899 Other long term (current) drug therapy: Principal | ICD-10-CM

## 2020-07-18 DIAGNOSIS — L089 Local infection of the skin and subcutaneous tissue, unspecified: Principal | ICD-10-CM

## 2020-07-18 DIAGNOSIS — I110 Hypertensive heart disease with heart failure: Principal | ICD-10-CM

## 2020-07-18 DIAGNOSIS — K219 Gastro-esophageal reflux disease without esophagitis: Principal | ICD-10-CM

## 2020-07-18 DIAGNOSIS — Z7901 Long term (current) use of anticoagulants: Principal | ICD-10-CM

## 2020-07-18 DIAGNOSIS — E1142 Type 2 diabetes mellitus with diabetic polyneuropathy: Principal | ICD-10-CM

## 2020-07-18 DIAGNOSIS — Z89512 Acquired absence of left leg below knee: Principal | ICD-10-CM

## 2020-07-18 DIAGNOSIS — S31109A Unspecified open wound of abdominal wall, unspecified quadrant without penetration into peritoneal cavity, initial encounter: Principal | ICD-10-CM

## 2020-07-18 DIAGNOSIS — L98493 Non-pressure chronic ulcer of skin of other sites with necrosis of muscle: Principal | ICD-10-CM

## 2020-07-18 DIAGNOSIS — J449 Chronic obstructive pulmonary disease, unspecified: Principal | ICD-10-CM

## 2020-07-18 DIAGNOSIS — Z95828 Presence of other vascular implants and grafts: Principal | ICD-10-CM

## 2020-07-18 DIAGNOSIS — Z794 Long term (current) use of insulin: Principal | ICD-10-CM

## 2020-07-18 DIAGNOSIS — Z87891 Personal history of nicotine dependence: Principal | ICD-10-CM

## 2020-07-18 DIAGNOSIS — I130 Hypertensive heart and chronic kidney disease with heart failure and stage 1 through stage 4 chronic kidney disease, or unspecified chronic kidney disease: Principal | ICD-10-CM

## 2020-07-18 MED ORDER — CIPROFLOXACIN 750 MG TABLET
ORAL_TABLET | Freq: Two times a day (BID) | ORAL | 0 refills | 10.00000 days | Status: CP
Start: 2020-07-18 — End: 2020-07-28

## 2020-07-20 ENCOUNTER — Ambulatory Visit: Admit: 2020-07-20 | Discharge: 2020-07-21 | Payer: BLUE CROSS/BLUE SHIELD

## 2020-07-20 DIAGNOSIS — L089 Local infection of the skin and subcutaneous tissue, unspecified: Secondary | ICD-10-CM

## 2020-07-20 DIAGNOSIS — E1142 Type 2 diabetes mellitus with diabetic polyneuropathy: Secondary | ICD-10-CM

## 2020-07-20 DIAGNOSIS — I739 Peripheral vascular disease, unspecified: Principal | ICD-10-CM

## 2020-07-20 DIAGNOSIS — I10 Essential (primary) hypertension: Principal | ICD-10-CM

## 2020-07-20 DIAGNOSIS — S31109A Unspecified open wound of abdominal wall, unspecified quadrant without penetration into peritoneal cavity, initial encounter: Principal | ICD-10-CM

## 2020-07-20 DIAGNOSIS — J449 Chronic obstructive pulmonary disease, unspecified: Principal | ICD-10-CM

## 2020-07-20 DIAGNOSIS — Z794 Long term (current) use of insulin: Principal | ICD-10-CM

## 2020-07-20 DIAGNOSIS — I5020 Unspecified systolic (congestive) heart failure: Principal | ICD-10-CM

## 2020-07-20 DIAGNOSIS — Z01818 Encounter for other preprocedural examination: Principal | ICD-10-CM

## 2020-07-20 DIAGNOSIS — L98493 Non-pressure chronic ulcer of skin of other sites with necrosis of muscle: Principal | ICD-10-CM

## 2020-07-20 DIAGNOSIS — E785 Hyperlipidemia, unspecified: Principal | ICD-10-CM

## 2020-07-21 DIAGNOSIS — Z79899 Other long term (current) drug therapy: Principal | ICD-10-CM

## 2020-07-21 DIAGNOSIS — E11622 Type 2 diabetes mellitus with other skin ulcer: Principal | ICD-10-CM

## 2020-07-21 DIAGNOSIS — K219 Gastro-esophageal reflux disease without esophagitis: Principal | ICD-10-CM

## 2020-07-21 DIAGNOSIS — E1142 Type 2 diabetes mellitus with diabetic polyneuropathy: Principal | ICD-10-CM

## 2020-07-21 DIAGNOSIS — Z794 Long term (current) use of insulin: Principal | ICD-10-CM

## 2020-07-21 DIAGNOSIS — Z87891 Personal history of nicotine dependence: Principal | ICD-10-CM

## 2020-07-21 DIAGNOSIS — E785 Hyperlipidemia, unspecified: Principal | ICD-10-CM

## 2020-07-21 DIAGNOSIS — Z7901 Long term (current) use of anticoagulants: Principal | ICD-10-CM

## 2020-07-21 DIAGNOSIS — I5022 Chronic systolic (congestive) heart failure: Principal | ICD-10-CM

## 2020-07-21 DIAGNOSIS — J449 Chronic obstructive pulmonary disease, unspecified: Principal | ICD-10-CM

## 2020-07-21 DIAGNOSIS — E1122 Type 2 diabetes mellitus with diabetic chronic kidney disease: Principal | ICD-10-CM

## 2020-07-21 DIAGNOSIS — N189 Chronic kidney disease, unspecified: Principal | ICD-10-CM

## 2020-07-21 DIAGNOSIS — Z7982 Long term (current) use of aspirin: Principal | ICD-10-CM

## 2020-07-21 DIAGNOSIS — Z1152 Encounter for screening for COVID-19: Principal | ICD-10-CM

## 2020-07-21 DIAGNOSIS — I130 Hypertensive heart and chronic kidney disease with heart failure and stage 1 through stage 4 chronic kidney disease, or unspecified chronic kidney disease: Principal | ICD-10-CM

## 2020-07-21 DIAGNOSIS — L97829 Non-pressure chronic ulcer of other part of left lower leg with unspecified severity: Principal | ICD-10-CM

## 2020-07-24 ENCOUNTER — Ambulatory Visit: Admit: 2020-07-24 | Discharge: 2020-07-24 | Payer: BLUE CROSS/BLUE SHIELD

## 2020-07-24 ENCOUNTER — Encounter
Admit: 2020-07-24 | Discharge: 2020-07-24 | Payer: BLUE CROSS/BLUE SHIELD | Attending: Student in an Organized Health Care Education/Training Program | Primary: Student in an Organized Health Care Education/Training Program

## 2020-07-24 DIAGNOSIS — E1122 Type 2 diabetes mellitus with diabetic chronic kidney disease: Principal | ICD-10-CM

## 2020-07-24 DIAGNOSIS — Z7982 Long term (current) use of aspirin: Principal | ICD-10-CM

## 2020-07-24 DIAGNOSIS — E11622 Type 2 diabetes mellitus with other skin ulcer: Principal | ICD-10-CM

## 2020-07-24 DIAGNOSIS — Z794 Long term (current) use of insulin: Principal | ICD-10-CM

## 2020-07-24 DIAGNOSIS — N189 Chronic kidney disease, unspecified: Principal | ICD-10-CM

## 2020-07-24 DIAGNOSIS — K219 Gastro-esophageal reflux disease without esophagitis: Principal | ICD-10-CM

## 2020-07-24 DIAGNOSIS — L97829 Non-pressure chronic ulcer of other part of left lower leg with unspecified severity: Principal | ICD-10-CM

## 2020-07-24 DIAGNOSIS — E785 Hyperlipidemia, unspecified: Principal | ICD-10-CM

## 2020-07-24 DIAGNOSIS — I5022 Chronic systolic (congestive) heart failure: Principal | ICD-10-CM

## 2020-07-24 DIAGNOSIS — Z87891 Personal history of nicotine dependence: Principal | ICD-10-CM

## 2020-07-24 DIAGNOSIS — Z79899 Other long term (current) drug therapy: Principal | ICD-10-CM

## 2020-07-24 DIAGNOSIS — J449 Chronic obstructive pulmonary disease, unspecified: Principal | ICD-10-CM

## 2020-07-24 DIAGNOSIS — I130 Hypertensive heart and chronic kidney disease with heart failure and stage 1 through stage 4 chronic kidney disease, or unspecified chronic kidney disease: Principal | ICD-10-CM

## 2020-07-24 DIAGNOSIS — E1142 Type 2 diabetes mellitus with diabetic polyneuropathy: Principal | ICD-10-CM

## 2020-07-24 DIAGNOSIS — Z7901 Long term (current) use of anticoagulants: Principal | ICD-10-CM

## 2020-07-24 MED ORDER — OXYCODONE 5 MG TABLET: 5 mg | tablet | 0 refills | 2 days | Status: AC

## 2020-07-24 MED ORDER — OXYCODONE 5 MG TABLET
ORAL_TABLET | ORAL | 0 refills | 2.00000 days | Status: CP | PRN
Start: 2020-07-24 — End: 2020-08-22

## 2020-07-25 ENCOUNTER — Encounter
Admit: 2020-07-25 | Discharge: 2020-07-26 | Payer: PRIVATE HEALTH INSURANCE | Attending: Student in an Organized Health Care Education/Training Program | Primary: Student in an Organized Health Care Education/Training Program

## 2020-07-25 DIAGNOSIS — S31109S Unspecified open wound of abdominal wall, unspecified quadrant without penetration into peritoneal cavity, sequela: Principal | ICD-10-CM

## 2020-07-25 DIAGNOSIS — I739 Peripheral vascular disease, unspecified: Principal | ICD-10-CM

## 2020-07-25 DIAGNOSIS — Z09 Encounter for follow-up examination after completed treatment for conditions other than malignant neoplasm: Principal | ICD-10-CM

## 2020-08-22 ENCOUNTER — Ambulatory Visit
Admit: 2020-08-22 | Discharge: 2020-08-23 | Payer: BLUE CROSS/BLUE SHIELD | Attending: Student in an Organized Health Care Education/Training Program | Primary: Student in an Organized Health Care Education/Training Program

## 2020-08-22 ENCOUNTER — Encounter: Admit: 2020-08-22 | Discharge: 2020-08-23 | Payer: BLUE CROSS/BLUE SHIELD

## 2020-08-22 DIAGNOSIS — I739 Peripheral vascular disease, unspecified: Principal | ICD-10-CM

## 2020-08-22 DIAGNOSIS — S31109S Unspecified open wound of abdominal wall, unspecified quadrant without penetration into peritoneal cavity, sequela: Principal | ICD-10-CM

## 2020-08-22 MED ORDER — TRIAMCINOLONE ACETONIDE 0.1 % TOPICAL OINTMENT
Freq: Two times a day (BID) | TOPICAL | 0 refills | 0 days | Status: CP
Start: 2020-08-22 — End: 2021-08-22

## 2020-09-05 ENCOUNTER — Encounter: Admit: 2020-09-05 | Discharge: 2020-09-05 | Payer: PRIVATE HEALTH INSURANCE

## 2020-09-05 DIAGNOSIS — R300 Dysuria: Principal | ICD-10-CM

## 2020-11-08 ENCOUNTER — Ambulatory Visit
Admit: 2020-11-08 | Discharge: 2020-11-16 | Disposition: A | Discharge status: Home Health | Payer: BLUE CROSS/BLUE SHIELD

## 2020-11-16 MED ORDER — CARVEDILOL 6.25 MG TABLET
ORAL_TABLET | Freq: Two times a day (BID) | ORAL | 0 refills | 30.00000 days | Status: CP
Start: 2020-11-16 — End: 2020-12-16

## 2020-11-16 MED ORDER — FUROSEMIDE 40 MG TABLET
ORAL_TABLET | Freq: Every day | ORAL | 0 refills | 30 days | Status: CP
Start: 2020-11-16 — End: 2020-12-16

## 2020-11-16 MED ORDER — LISINOPRIL 2.5 MG TABLET
ORAL_TABLET | Freq: Every day | ORAL | 0 refills | 30 days | Status: CP
Start: 2020-11-16 — End: 2020-12-16

## 2020-11-16 MED ORDER — METFORMIN 1,000 MG TABLET
ORAL_TABLET | Freq: Two times a day (BID) | ORAL | 0 refills | 30.00000 days | Status: CP
Start: 2020-11-16 — End: 2020-12-16

## 2020-11-17 MED ORDER — POTASSIUM CHLORIDE ER 20 MEQ TABLET,EXTENDED RELEASE(PART/CRYST)
ORAL_TABLET | Freq: Every day | ORAL | 11 refills | 30 days | Status: CP
Start: 2020-11-17 — End: 2021-11-17

## 2020-11-29 ENCOUNTER — Ambulatory Visit
Admit: 2020-11-29 | Discharge: 2020-12-04 | Disposition: A | Discharge status: Home / Self Care | Payer: PRIVATE HEALTH INSURANCE

## 2020-11-29 ENCOUNTER — Encounter
Admit: 2020-11-29 | Discharge: 2020-12-04 | Disposition: A | Discharge status: Home / Self Care | Payer: BLUE CROSS/BLUE SHIELD | Attending: Anesthesiology

## 2020-11-29 ENCOUNTER — Ambulatory Visit
Admit: 2020-11-29 | Discharge: 2020-12-04 | Disposition: A | Discharge status: Home / Self Care | Payer: BLUE CROSS/BLUE SHIELD

## 2020-12-04 MED ORDER — CARVEDILOL 3.125 MG TABLET
ORAL_TABLET | Freq: Two times a day (BID) | ORAL | 0 refills | 30 days | Status: CP
Start: 2020-12-04 — End: 2021-01-03

## 2020-12-04 MED ORDER — LISINOPRIL 2.5 MG TABLET
ORAL_TABLET | Freq: Every day | ORAL | 0 refills | 30.00000 days | Status: CP
Start: 2020-12-04 — End: 2021-01-03

## 2020-12-06 MED ORDER — AMLODIPINE 10 MG TABLET
Freq: Every day | ORAL | 0 days
Start: 2020-12-06 — End: ?

## 2020-12-19 ENCOUNTER — Encounter: Admit: 2020-12-19 | Discharge: 2020-12-20 | Payer: BLUE CROSS/BLUE SHIELD

## 2020-12-19 DIAGNOSIS — I5022 Chronic systolic (congestive) heart failure: Principal | ICD-10-CM

## 2020-12-19 DIAGNOSIS — E782 Mixed hyperlipidemia: Principal | ICD-10-CM

## 2020-12-19 DIAGNOSIS — I739 Peripheral vascular disease, unspecified: Principal | ICD-10-CM

## 2020-12-19 DIAGNOSIS — I25118 Atherosclerotic heart disease of native coronary artery with other forms of angina pectoris: Principal | ICD-10-CM

## 2020-12-19 DIAGNOSIS — I10 Essential (primary) hypertension: Principal | ICD-10-CM

## 2020-12-19 DIAGNOSIS — Z89512 Acquired absence of left leg below knee: Principal | ICD-10-CM

## 2020-12-19 MED ORDER — CARVEDILOL 6.25 MG TABLET
ORAL_TABLET | Freq: Two times a day (BID) | ORAL | 3 refills | 90.00000 days | Status: CP
Start: 2020-12-19 — End: ?

## 2020-12-19 MED ORDER — LISINOPRIL 10 MG TABLET
ORAL_TABLET | Freq: Every day | ORAL | 3 refills | 90 days | Status: CP
Start: 2020-12-19 — End: ?

## 2021-02-21 ENCOUNTER — Ambulatory Visit
Admit: 2021-02-21 | Discharge: 2021-02-23 | Disposition: A | Discharge status: Home / Self Care | Payer: BLUE CROSS/BLUE SHIELD

## 2021-02-23 MED ORDER — FUROSEMIDE 20 MG TABLET
ORAL_TABLET | Freq: Every day | ORAL | 1 refills | 30.00000 days | Status: CP
Start: 2021-02-23 — End: 2021-03-25

## 2021-02-23 MED ORDER — NITROFURANTOIN MONOHYDRATE/MACROCRYSTALS 100 MG CAPSULE
ORAL_CAPSULE | Freq: Two times a day (BID) | ORAL | 0 refills | 7 days | Status: CP
Start: 2021-02-23 — End: 2021-03-02

## 2021-02-23 MED ORDER — OSELTAMIVIR 75 MG CAPSULE
ORAL_CAPSULE | Freq: Two times a day (BID) | ORAL | 0 refills | 3 days | Status: CP
Start: 2021-02-23 — End: 2021-02-26

## 2021-02-23 MED ORDER — PREDNISONE 5 MG TABLET
ORAL_TABLET | 0 refills | 0 days | Status: CP
Start: 2021-02-23 — End: ?

## 2021-02-23 MED ORDER — IPRATROPIUM 0.5 MG-ALBUTEROL 3 MG (2.5 MG BASE)/3 ML NEBULIZATION SOLN
Freq: Four times a day (QID) | RESPIRATORY_TRACT | 0 refills | 15.00000 days | Status: CP | PRN
Start: 2021-02-23 — End: 2021-03-10

## 2021-07-12 ENCOUNTER — Ambulatory Visit
Admit: 2021-07-12 | Discharge: 2021-07-15 | Disposition: A | Discharge status: Home / Self Care | Payer: BLUE CROSS/BLUE SHIELD

## 2021-07-15 MED ORDER — SODIUM CHLORIDE 1,000 MG SOLUBLE TABLET
ORAL_TABLET | Freq: Three times a day (TID) | ORAL | 0 refills | 3 days | Status: CP
Start: 2021-07-15 — End: 2021-07-18

## 2021-07-15 MED ORDER — IPRATROPIUM 0.5 MG-ALBUTEROL 3 MG (2.5 MG BASE)/3 ML NEBULIZATION SOLN
Freq: Four times a day (QID) | RESPIRATORY_TRACT | 0 refills | 15 days | Status: CP | PRN
Start: 2021-07-15 — End: 2021-07-30

## 2021-07-17 MED ORDER — SULFAMETHOXAZOLE 800 MG-TRIMETHOPRIM 160 MG TABLET
ORAL_TABLET | Freq: Two times a day (BID) | ORAL | 0 refills | 5.00000 days | Status: CP
Start: 2021-07-17 — End: 2021-07-22

## 2021-12-25 MED ORDER — CARVEDILOL 6.25 MG TABLET
ORAL_TABLET | 3 refills | 0 days
Start: 2021-12-25 — End: ?

## 2022-02-12 ENCOUNTER — Emergency Department
Admit: 2022-02-12 | Discharge: 2022-02-12 | Disposition: A | Discharge status: Home / Self Care | Payer: PRIVATE HEALTH INSURANCE

## 2022-02-12 ENCOUNTER — Ambulatory Visit
Admit: 2022-02-12 | Discharge: 2022-02-12 | Disposition: A | Discharge status: Home / Self Care | Payer: PRIVATE HEALTH INSURANCE

## 2022-02-12 DIAGNOSIS — R0602 Shortness of breath: Principal | ICD-10-CM

## 2022-02-12 DIAGNOSIS — J189 Pneumonia, unspecified organism: Principal | ICD-10-CM

## 2022-02-12 MED ORDER — ALBUTEROL SULFATE HFA 90 MCG/ACTUATION AEROSOL INHALER
RESPIRATORY_TRACT | 0 refills | 0.00000 days | Status: CP | PRN
Start: 2022-02-12 — End: 2023-02-12

## 2022-02-12 MED ORDER — AZITHROMYCIN 250 MG TABLET
ORAL_TABLET | Freq: Every day | ORAL | 0 refills | 6.00000 days | Status: CP
Start: 2022-02-12 — End: 2022-02-12

## 2022-02-12 MED ORDER — PREDNISONE 20 MG TABLET
ORAL_TABLET | ORAL | 0 refills | 0.00000 days | Status: CP
Start: 2022-02-12 — End: 2022-02-12

## 2022-04-16 DIAGNOSIS — I509 Heart failure, unspecified: Secondary | ICD-10-CM | POA: Diagnosis not present

## 2022-05-01 DIAGNOSIS — I509 Heart failure, unspecified: Secondary | ICD-10-CM | POA: Diagnosis not present

## 2022-06-12 DIAGNOSIS — D529 Folate deficiency anemia, unspecified: Secondary | ICD-10-CM | POA: Diagnosis not present

## 2022-06-12 DIAGNOSIS — D649 Anemia, unspecified: Secondary | ICD-10-CM | POA: Diagnosis not present

## 2022-06-12 DIAGNOSIS — I10 Essential (primary) hypertension: Secondary | ICD-10-CM | POA: Diagnosis not present

## 2022-06-12 DIAGNOSIS — E782 Mixed hyperlipidemia: Secondary | ICD-10-CM | POA: Diagnosis not present

## 2022-06-12 DIAGNOSIS — D519 Vitamin B12 deficiency anemia, unspecified: Secondary | ICD-10-CM | POA: Diagnosis not present

## 2022-06-12 DIAGNOSIS — K219 Gastro-esophageal reflux disease without esophagitis: Secondary | ICD-10-CM | POA: Diagnosis not present

## 2022-06-17 DIAGNOSIS — E162 Hypoglycemia, unspecified: Secondary | ICD-10-CM | POA: Diagnosis not present

## 2022-06-17 DIAGNOSIS — J449 Chronic obstructive pulmonary disease, unspecified: Secondary | ICD-10-CM | POA: Diagnosis not present

## 2022-06-17 DIAGNOSIS — I5023 Acute on chronic systolic (congestive) heart failure: Secondary | ICD-10-CM | POA: Diagnosis not present

## 2022-06-17 DIAGNOSIS — E1149 Type 2 diabetes mellitus with other diabetic neurological complication: Secondary | ICD-10-CM | POA: Diagnosis not present

## 2022-06-17 DIAGNOSIS — R69 Illness, unspecified: Secondary | ICD-10-CM | POA: Diagnosis not present

## 2022-06-17 DIAGNOSIS — R4582 Worries: Secondary | ICD-10-CM | POA: Diagnosis not present

## 2022-06-17 DIAGNOSIS — Z23 Encounter for immunization: Secondary | ICD-10-CM | POA: Diagnosis not present

## 2022-06-17 DIAGNOSIS — E7849 Other hyperlipidemia: Secondary | ICD-10-CM | POA: Diagnosis not present

## 2022-06-17 DIAGNOSIS — E1159 Type 2 diabetes mellitus with other circulatory complications: Secondary | ICD-10-CM | POA: Diagnosis not present

## 2022-06-17 DIAGNOSIS — E1151 Type 2 diabetes mellitus with diabetic peripheral angiopathy without gangrene: Secondary | ICD-10-CM | POA: Diagnosis not present

## 2022-06-17 DIAGNOSIS — I10 Essential (primary) hypertension: Secondary | ICD-10-CM | POA: Diagnosis not present

## 2022-06-17 DIAGNOSIS — R609 Edema, unspecified: Secondary | ICD-10-CM | POA: Diagnosis not present

## 2022-06-18 DIAGNOSIS — J449 Chronic obstructive pulmonary disease, unspecified: Secondary | ICD-10-CM | POA: Diagnosis not present

## 2022-07-19 DIAGNOSIS — J449 Chronic obstructive pulmonary disease, unspecified: Secondary | ICD-10-CM | POA: Diagnosis not present

## 2022-08-18 DIAGNOSIS — J449 Chronic obstructive pulmonary disease, unspecified: Secondary | ICD-10-CM | POA: Diagnosis not present

## 2022-08-28 DIAGNOSIS — J449 Chronic obstructive pulmonary disease, unspecified: Secondary | ICD-10-CM | POA: Diagnosis not present

## 2022-08-28 DIAGNOSIS — E1149 Type 2 diabetes mellitus with other diabetic neurological complication: Secondary | ICD-10-CM | POA: Diagnosis not present

## 2022-08-28 DIAGNOSIS — D519 Vitamin B12 deficiency anemia, unspecified: Secondary | ICD-10-CM | POA: Diagnosis not present

## 2022-08-28 DIAGNOSIS — D649 Anemia, unspecified: Secondary | ICD-10-CM | POA: Diagnosis not present

## 2022-08-28 DIAGNOSIS — E7800 Pure hypercholesterolemia, unspecified: Secondary | ICD-10-CM | POA: Diagnosis not present

## 2022-08-28 DIAGNOSIS — E782 Mixed hyperlipidemia: Secondary | ICD-10-CM | POA: Diagnosis not present

## 2022-08-28 DIAGNOSIS — D529 Folate deficiency anemia, unspecified: Secondary | ICD-10-CM | POA: Diagnosis not present

## 2022-08-28 DIAGNOSIS — E1151 Type 2 diabetes mellitus with diabetic peripheral angiopathy without gangrene: Secondary | ICD-10-CM | POA: Diagnosis not present

## 2022-09-03 DIAGNOSIS — R4582 Worries: Secondary | ICD-10-CM | POA: Diagnosis not present

## 2022-09-03 DIAGNOSIS — E1165 Type 2 diabetes mellitus with hyperglycemia: Secondary | ICD-10-CM | POA: Diagnosis not present

## 2022-09-03 DIAGNOSIS — E162 Hypoglycemia, unspecified: Secondary | ICD-10-CM | POA: Diagnosis not present

## 2022-09-03 DIAGNOSIS — E1149 Type 2 diabetes mellitus with other diabetic neurological complication: Secondary | ICD-10-CM | POA: Diagnosis not present

## 2022-09-03 DIAGNOSIS — D649 Anemia, unspecified: Secondary | ICD-10-CM | POA: Diagnosis not present

## 2022-09-03 DIAGNOSIS — I10 Essential (primary) hypertension: Secondary | ICD-10-CM | POA: Diagnosis not present

## 2022-09-03 DIAGNOSIS — I5023 Acute on chronic systolic (congestive) heart failure: Secondary | ICD-10-CM | POA: Diagnosis not present

## 2022-09-03 DIAGNOSIS — E1151 Type 2 diabetes mellitus with diabetic peripheral angiopathy without gangrene: Secondary | ICD-10-CM | POA: Diagnosis not present

## 2022-09-03 DIAGNOSIS — J449 Chronic obstructive pulmonary disease, unspecified: Secondary | ICD-10-CM | POA: Diagnosis not present

## 2022-09-03 DIAGNOSIS — R609 Edema, unspecified: Secondary | ICD-10-CM | POA: Diagnosis not present

## 2022-09-03 DIAGNOSIS — R69 Illness, unspecified: Secondary | ICD-10-CM | POA: Diagnosis not present

## 2022-09-03 DIAGNOSIS — Z89519 Acquired absence of unspecified leg below knee: Secondary | ICD-10-CM | POA: Diagnosis not present

## 2022-09-03 DIAGNOSIS — E1159 Type 2 diabetes mellitus with other circulatory complications: Secondary | ICD-10-CM | POA: Diagnosis not present

## 2022-09-18 DIAGNOSIS — J449 Chronic obstructive pulmonary disease, unspecified: Secondary | ICD-10-CM | POA: Diagnosis not present

## 2022-10-05 DIAGNOSIS — I509 Heart failure, unspecified: Secondary | ICD-10-CM | POA: Diagnosis not present

## 2022-10-19 DIAGNOSIS — J449 Chronic obstructive pulmonary disease, unspecified: Secondary | ICD-10-CM | POA: Diagnosis not present

## 2022-11-05 DIAGNOSIS — I509 Heart failure, unspecified: Secondary | ICD-10-CM | POA: Diagnosis not present

## 2022-11-17 DIAGNOSIS — J449 Chronic obstructive pulmonary disease, unspecified: Secondary | ICD-10-CM | POA: Diagnosis not present

## 2022-11-26 DIAGNOSIS — K219 Gastro-esophageal reflux disease without esophagitis: Secondary | ICD-10-CM | POA: Diagnosis not present

## 2022-11-26 DIAGNOSIS — E7801 Familial hypercholesterolemia: Secondary | ICD-10-CM | POA: Diagnosis not present

## 2022-11-26 DIAGNOSIS — R300 Dysuria: Secondary | ICD-10-CM | POA: Diagnosis not present

## 2022-11-26 DIAGNOSIS — E875 Hyperkalemia: Secondary | ICD-10-CM | POA: Diagnosis not present

## 2022-11-26 DIAGNOSIS — D519 Vitamin B12 deficiency anemia, unspecified: Secondary | ICD-10-CM | POA: Diagnosis not present

## 2022-11-26 DIAGNOSIS — E7800 Pure hypercholesterolemia, unspecified: Secondary | ICD-10-CM | POA: Diagnosis not present

## 2022-11-26 DIAGNOSIS — D509 Iron deficiency anemia, unspecified: Secondary | ICD-10-CM | POA: Diagnosis not present

## 2022-11-26 DIAGNOSIS — D649 Anemia, unspecified: Secondary | ICD-10-CM | POA: Diagnosis not present

## 2022-11-26 DIAGNOSIS — E1159 Type 2 diabetes mellitus with other circulatory complications: Secondary | ICD-10-CM | POA: Diagnosis not present

## 2022-11-26 DIAGNOSIS — D529 Folate deficiency anemia, unspecified: Secondary | ICD-10-CM | POA: Diagnosis not present

## 2022-12-03 DIAGNOSIS — E1149 Type 2 diabetes mellitus with other diabetic neurological complication: Secondary | ICD-10-CM | POA: Diagnosis not present

## 2022-12-03 DIAGNOSIS — E7849 Other hyperlipidemia: Secondary | ICD-10-CM | POA: Diagnosis not present

## 2022-12-03 DIAGNOSIS — Z23 Encounter for immunization: Secondary | ICD-10-CM | POA: Diagnosis not present

## 2022-12-03 DIAGNOSIS — I5023 Acute on chronic systolic (congestive) heart failure: Secondary | ICD-10-CM | POA: Diagnosis not present

## 2022-12-03 DIAGNOSIS — E162 Hypoglycemia, unspecified: Secondary | ICD-10-CM | POA: Diagnosis not present

## 2022-12-03 DIAGNOSIS — J449 Chronic obstructive pulmonary disease, unspecified: Secondary | ICD-10-CM | POA: Diagnosis not present

## 2022-12-03 DIAGNOSIS — D649 Anemia, unspecified: Secondary | ICD-10-CM | POA: Diagnosis not present

## 2022-12-03 DIAGNOSIS — R4582 Worries: Secondary | ICD-10-CM | POA: Diagnosis not present

## 2022-12-03 DIAGNOSIS — R609 Edema, unspecified: Secondary | ICD-10-CM | POA: Diagnosis not present

## 2022-12-03 DIAGNOSIS — E1151 Type 2 diabetes mellitus with diabetic peripheral angiopathy without gangrene: Secondary | ICD-10-CM | POA: Diagnosis not present

## 2022-12-03 DIAGNOSIS — R69 Illness, unspecified: Secondary | ICD-10-CM | POA: Diagnosis not present

## 2022-12-03 DIAGNOSIS — E1165 Type 2 diabetes mellitus with hyperglycemia: Secondary | ICD-10-CM | POA: Diagnosis not present

## 2022-12-03 DIAGNOSIS — I10 Essential (primary) hypertension: Secondary | ICD-10-CM | POA: Diagnosis not present

## 2022-12-04 DIAGNOSIS — I509 Heart failure, unspecified: Secondary | ICD-10-CM | POA: Diagnosis not present

## 2022-12-18 DIAGNOSIS — J449 Chronic obstructive pulmonary disease, unspecified: Secondary | ICD-10-CM | POA: Diagnosis not present

## 2023-01-04 DIAGNOSIS — I509 Heart failure, unspecified: Secondary | ICD-10-CM | POA: Diagnosis not present

## 2023-01-17 DIAGNOSIS — J449 Chronic obstructive pulmonary disease, unspecified: Secondary | ICD-10-CM | POA: Diagnosis not present

## 2023-02-03 DIAGNOSIS — I509 Heart failure, unspecified: Secondary | ICD-10-CM | POA: Diagnosis not present

## 2023-02-17 DIAGNOSIS — J449 Chronic obstructive pulmonary disease, unspecified: Secondary | ICD-10-CM | POA: Diagnosis not present

## 2023-02-18 DIAGNOSIS — E1149 Type 2 diabetes mellitus with other diabetic neurological complication: Secondary | ICD-10-CM | POA: Diagnosis not present

## 2023-02-18 DIAGNOSIS — K219 Gastro-esophageal reflux disease without esophagitis: Secondary | ICD-10-CM | POA: Diagnosis not present

## 2023-02-18 DIAGNOSIS — E875 Hyperkalemia: Secondary | ICD-10-CM | POA: Diagnosis not present

## 2023-02-18 DIAGNOSIS — D529 Folate deficiency anemia, unspecified: Secondary | ICD-10-CM | POA: Diagnosis not present

## 2023-02-18 DIAGNOSIS — E7849 Other hyperlipidemia: Secondary | ICD-10-CM | POA: Diagnosis not present

## 2023-02-18 DIAGNOSIS — D649 Anemia, unspecified: Secondary | ICD-10-CM | POA: Diagnosis not present

## 2023-02-18 DIAGNOSIS — E162 Hypoglycemia, unspecified: Secondary | ICD-10-CM | POA: Diagnosis not present

## 2023-02-19 DIAGNOSIS — I739 Peripheral vascular disease, unspecified: Secondary | ICD-10-CM | POA: Diagnosis not present

## 2023-02-19 DIAGNOSIS — E1149 Type 2 diabetes mellitus with other diabetic neurological complication: Secondary | ICD-10-CM | POA: Diagnosis not present

## 2023-02-19 DIAGNOSIS — Z720 Tobacco use: Secondary | ICD-10-CM | POA: Diagnosis not present

## 2023-02-19 DIAGNOSIS — I10 Essential (primary) hypertension: Secondary | ICD-10-CM | POA: Diagnosis not present

## 2023-02-19 DIAGNOSIS — E162 Hypoglycemia, unspecified: Secondary | ICD-10-CM | POA: Diagnosis not present

## 2023-02-19 DIAGNOSIS — E1165 Type 2 diabetes mellitus with hyperglycemia: Secondary | ICD-10-CM | POA: Diagnosis not present

## 2023-02-19 DIAGNOSIS — I5023 Acute on chronic systolic (congestive) heart failure: Secondary | ICD-10-CM | POA: Diagnosis not present

## 2023-02-19 DIAGNOSIS — Z1331 Encounter for screening for depression: Secondary | ICD-10-CM | POA: Diagnosis not present

## 2023-02-19 DIAGNOSIS — E7849 Other hyperlipidemia: Secondary | ICD-10-CM | POA: Diagnosis not present

## 2023-02-19 DIAGNOSIS — E875 Hyperkalemia: Secondary | ICD-10-CM | POA: Diagnosis not present

## 2023-02-19 DIAGNOSIS — R609 Edema, unspecified: Secondary | ICD-10-CM | POA: Diagnosis not present

## 2023-02-19 DIAGNOSIS — Z1389 Encounter for screening for other disorder: Secondary | ICD-10-CM | POA: Diagnosis not present

## 2023-03-06 DIAGNOSIS — I509 Heart failure, unspecified: Secondary | ICD-10-CM | POA: Diagnosis not present

## 2023-03-19 DIAGNOSIS — J449 Chronic obstructive pulmonary disease, unspecified: Secondary | ICD-10-CM | POA: Diagnosis not present

## 2023-04-05 DIAGNOSIS — I509 Heart failure, unspecified: Secondary | ICD-10-CM | POA: Diagnosis not present

## 2023-04-19 DIAGNOSIS — J449 Chronic obstructive pulmonary disease, unspecified: Secondary | ICD-10-CM | POA: Diagnosis not present

## 2023-05-06 DIAGNOSIS — I509 Heart failure, unspecified: Secondary | ICD-10-CM | POA: Diagnosis not present

## 2023-05-20 DIAGNOSIS — J449 Chronic obstructive pulmonary disease, unspecified: Secondary | ICD-10-CM | POA: Diagnosis not present

## 2023-05-20 DIAGNOSIS — D519 Vitamin B12 deficiency anemia, unspecified: Secondary | ICD-10-CM | POA: Diagnosis not present

## 2023-05-20 DIAGNOSIS — D529 Folate deficiency anemia, unspecified: Secondary | ICD-10-CM | POA: Diagnosis not present

## 2023-05-20 DIAGNOSIS — E875 Hyperkalemia: Secondary | ICD-10-CM | POA: Diagnosis not present

## 2023-05-20 DIAGNOSIS — E782 Mixed hyperlipidemia: Secondary | ICD-10-CM | POA: Diagnosis not present

## 2023-05-20 DIAGNOSIS — E7801 Familial hypercholesterolemia: Secondary | ICD-10-CM | POA: Diagnosis not present

## 2023-05-20 DIAGNOSIS — E7800 Pure hypercholesterolemia, unspecified: Secondary | ICD-10-CM | POA: Diagnosis not present

## 2023-05-20 DIAGNOSIS — D509 Iron deficiency anemia, unspecified: Secondary | ICD-10-CM | POA: Diagnosis not present

## 2023-05-20 DIAGNOSIS — K219 Gastro-esophageal reflux disease without esophagitis: Secondary | ICD-10-CM | POA: Diagnosis not present

## 2023-05-20 DIAGNOSIS — E1149 Type 2 diabetes mellitus with other diabetic neurological complication: Secondary | ICD-10-CM | POA: Diagnosis not present

## 2023-05-20 DIAGNOSIS — E7849 Other hyperlipidemia: Secondary | ICD-10-CM | POA: Diagnosis not present

## 2023-05-27 DIAGNOSIS — E1149 Type 2 diabetes mellitus with other diabetic neurological complication: Secondary | ICD-10-CM | POA: Diagnosis not present

## 2023-05-27 DIAGNOSIS — I10 Essential (primary) hypertension: Secondary | ICD-10-CM | POA: Diagnosis not present

## 2023-05-27 DIAGNOSIS — F17200 Nicotine dependence, unspecified, uncomplicated: Secondary | ICD-10-CM | POA: Diagnosis not present

## 2023-05-27 DIAGNOSIS — K219 Gastro-esophageal reflux disease without esophagitis: Secondary | ICD-10-CM | POA: Diagnosis not present

## 2023-05-27 DIAGNOSIS — E162 Hypoglycemia, unspecified: Secondary | ICD-10-CM | POA: Diagnosis not present

## 2023-05-27 DIAGNOSIS — E7849 Other hyperlipidemia: Secondary | ICD-10-CM | POA: Diagnosis not present

## 2023-05-27 DIAGNOSIS — G629 Polyneuropathy, unspecified: Secondary | ICD-10-CM | POA: Diagnosis not present

## 2023-05-27 DIAGNOSIS — J449 Chronic obstructive pulmonary disease, unspecified: Secondary | ICD-10-CM | POA: Diagnosis not present

## 2023-05-27 DIAGNOSIS — E1151 Type 2 diabetes mellitus with diabetic peripheral angiopathy without gangrene: Secondary | ICD-10-CM | POA: Diagnosis not present

## 2023-05-27 DIAGNOSIS — R4582 Worries: Secondary | ICD-10-CM | POA: Diagnosis not present

## 2023-05-27 DIAGNOSIS — Z23 Encounter for immunization: Secondary | ICD-10-CM | POA: Diagnosis not present

## 2023-05-27 DIAGNOSIS — I739 Peripheral vascular disease, unspecified: Secondary | ICD-10-CM | POA: Diagnosis not present

## 2023-05-27 DIAGNOSIS — E1159 Type 2 diabetes mellitus with other circulatory complications: Secondary | ICD-10-CM | POA: Diagnosis not present

## 2023-05-27 DIAGNOSIS — D649 Anemia, unspecified: Secondary | ICD-10-CM | POA: Diagnosis not present

## 2023-05-27 DIAGNOSIS — M069 Rheumatoid arthritis, unspecified: Secondary | ICD-10-CM | POA: Diagnosis not present

## 2023-05-27 DIAGNOSIS — R609 Edema, unspecified: Secondary | ICD-10-CM | POA: Diagnosis not present

## 2023-05-27 DIAGNOSIS — E1165 Type 2 diabetes mellitus with hyperglycemia: Secondary | ICD-10-CM | POA: Diagnosis not present

## 2023-05-27 DIAGNOSIS — E782 Mixed hyperlipidemia: Secondary | ICD-10-CM | POA: Diagnosis not present

## 2023-05-27 DIAGNOSIS — Z720 Tobacco use: Secondary | ICD-10-CM | POA: Diagnosis not present

## 2023-06-06 DIAGNOSIS — I509 Heart failure, unspecified: Secondary | ICD-10-CM | POA: Diagnosis not present

## 2023-07-06 DIAGNOSIS — I509 Heart failure, unspecified: Secondary | ICD-10-CM | POA: Diagnosis not present

## 2023-08-06 DIAGNOSIS — I509 Heart failure, unspecified: Secondary | ICD-10-CM | POA: Diagnosis not present

## 2023-08-19 DIAGNOSIS — E1149 Type 2 diabetes mellitus with other diabetic neurological complication: Secondary | ICD-10-CM | POA: Diagnosis not present

## 2023-08-19 DIAGNOSIS — E1151 Type 2 diabetes mellitus with diabetic peripheral angiopathy without gangrene: Secondary | ICD-10-CM | POA: Diagnosis not present

## 2023-08-19 DIAGNOSIS — E875 Hyperkalemia: Secondary | ICD-10-CM | POA: Diagnosis not present

## 2023-08-19 DIAGNOSIS — E782 Mixed hyperlipidemia: Secondary | ICD-10-CM | POA: Diagnosis not present

## 2023-08-19 DIAGNOSIS — E7849 Other hyperlipidemia: Secondary | ICD-10-CM | POA: Diagnosis not present

## 2023-08-19 DIAGNOSIS — D649 Anemia, unspecified: Secondary | ICD-10-CM | POA: Diagnosis not present

## 2023-08-19 DIAGNOSIS — D509 Iron deficiency anemia, unspecified: Secondary | ICD-10-CM | POA: Diagnosis not present

## 2023-08-19 DIAGNOSIS — K219 Gastro-esophageal reflux disease without esophagitis: Secondary | ICD-10-CM | POA: Diagnosis not present

## 2023-09-05 DIAGNOSIS — I509 Heart failure, unspecified: Secondary | ICD-10-CM | POA: Diagnosis not present

## 2023-10-06 DIAGNOSIS — I509 Heart failure, unspecified: Secondary | ICD-10-CM | POA: Diagnosis not present

## 2023-11-06 DIAGNOSIS — I509 Heart failure, unspecified: Secondary | ICD-10-CM | POA: Diagnosis not present

## 2023-11-19 DIAGNOSIS — E6609 Other obesity due to excess calories: Secondary | ICD-10-CM | POA: Diagnosis not present

## 2023-11-19 DIAGNOSIS — G894 Chronic pain syndrome: Secondary | ICD-10-CM | POA: Diagnosis not present

## 2023-11-19 DIAGNOSIS — G629 Polyneuropathy, unspecified: Secondary | ICD-10-CM | POA: Diagnosis not present

## 2023-11-19 DIAGNOSIS — E1140 Type 2 diabetes mellitus with diabetic neuropathy, unspecified: Secondary | ICD-10-CM | POA: Diagnosis not present

## 2023-12-04 DIAGNOSIS — I509 Heart failure, unspecified: Secondary | ICD-10-CM | POA: Diagnosis not present

## 2023-12-06 ENCOUNTER — Ambulatory Visit: Admit: 2023-12-06 | Discharge: 2023-12-11 | Payer: MEDICARE

## 2023-12-06 ENCOUNTER — Inpatient Hospital Stay
Admit: 2023-12-06 | Discharge: 2023-12-11 | Disposition: A | Discharge status: Skilled Nursing Facility | Payer: MEDICARE | Admitting: Student in an Organized Health Care Education/Training Program

## 2023-12-06 ENCOUNTER — Ambulatory Visit: Admit: 2023-12-06 | Discharge: 2023-12-11

## 2023-12-06 DIAGNOSIS — I429 Cardiomyopathy, unspecified: Secondary | ICD-10-CM | POA: Diagnosis not present

## 2023-12-06 DIAGNOSIS — M62561 Muscle wasting and atrophy, not elsewhere classified, right lower leg: Secondary | ICD-10-CM | POA: Diagnosis not present

## 2023-12-06 DIAGNOSIS — J449 Chronic obstructive pulmonary disease, unspecified: Secondary | ICD-10-CM | POA: Diagnosis not present

## 2023-12-06 DIAGNOSIS — M5090 Cervical disc disorder, unspecified, unspecified cervical region: Secondary | ICD-10-CM | POA: Diagnosis not present

## 2023-12-06 DIAGNOSIS — A419 Sepsis, unspecified organism: Secondary | ICD-10-CM | POA: Diagnosis not present

## 2023-12-06 DIAGNOSIS — Z7952 Long term (current) use of systemic steroids: Secondary | ICD-10-CM | POA: Diagnosis not present

## 2023-12-06 DIAGNOSIS — Z79899 Other long term (current) drug therapy: Secondary | ICD-10-CM | POA: Diagnosis not present

## 2023-12-06 DIAGNOSIS — M62521 Muscle wasting and atrophy, not elsewhere classified, right upper arm: Secondary | ICD-10-CM | POA: Diagnosis not present

## 2023-12-06 DIAGNOSIS — I25118 Atherosclerotic heart disease of native coronary artery with other forms of angina pectoris: Secondary | ICD-10-CM | POA: Diagnosis not present

## 2023-12-06 DIAGNOSIS — R2689 Other abnormalities of gait and mobility: Secondary | ICD-10-CM | POA: Diagnosis not present

## 2023-12-06 DIAGNOSIS — Z89512 Acquired absence of left leg below knee: Secondary | ICD-10-CM | POA: Diagnosis not present

## 2023-12-06 DIAGNOSIS — I3481 Nonrheumatic mitral (valve) annulus calcification: Secondary | ICD-10-CM | POA: Diagnosis not present

## 2023-12-06 DIAGNOSIS — M059 Rheumatoid arthritis with rheumatoid factor, unspecified: Secondary | ICD-10-CM | POA: Diagnosis not present

## 2023-12-06 DIAGNOSIS — Z7982 Long term (current) use of aspirin: Secondary | ICD-10-CM | POA: Diagnosis not present

## 2023-12-06 DIAGNOSIS — R918 Other nonspecific abnormal finding of lung field: Secondary | ICD-10-CM | POA: Diagnosis not present

## 2023-12-06 DIAGNOSIS — J9621 Acute and chronic respiratory failure with hypoxia: Secondary | ICD-10-CM | POA: Diagnosis not present

## 2023-12-06 DIAGNOSIS — J9612 Chronic respiratory failure with hypercapnia: Secondary | ICD-10-CM | POA: Diagnosis not present

## 2023-12-06 DIAGNOSIS — I5022 Chronic systolic (congestive) heart failure: Secondary | ICD-10-CM | POA: Diagnosis not present

## 2023-12-06 DIAGNOSIS — I110 Hypertensive heart disease with heart failure: Secondary | ICD-10-CM | POA: Diagnosis not present

## 2023-12-06 DIAGNOSIS — Z792 Long term (current) use of antibiotics: Secondary | ICD-10-CM | POA: Diagnosis not present

## 2023-12-06 DIAGNOSIS — G8929 Other chronic pain: Secondary | ICD-10-CM | POA: Diagnosis not present

## 2023-12-06 DIAGNOSIS — G609 Hereditary and idiopathic neuropathy, unspecified: Secondary | ICD-10-CM | POA: Diagnosis not present

## 2023-12-06 DIAGNOSIS — E785 Hyperlipidemia, unspecified: Secondary | ICD-10-CM | POA: Diagnosis not present

## 2023-12-06 DIAGNOSIS — I517 Cardiomegaly: Secondary | ICD-10-CM | POA: Diagnosis not present

## 2023-12-06 DIAGNOSIS — J440 Chronic obstructive pulmonary disease with acute lower respiratory infection: Secondary | ICD-10-CM | POA: Diagnosis not present

## 2023-12-06 DIAGNOSIS — R41841 Cognitive communication deficit: Secondary | ICD-10-CM | POA: Diagnosis not present

## 2023-12-06 DIAGNOSIS — I340 Nonrheumatic mitral (valve) insufficiency: Secondary | ICD-10-CM | POA: Diagnosis not present

## 2023-12-06 DIAGNOSIS — Z20822 Contact with and (suspected) exposure to covid-19: Secondary | ICD-10-CM | POA: Diagnosis not present

## 2023-12-06 DIAGNOSIS — Z7901 Long term (current) use of anticoagulants: Secondary | ICD-10-CM | POA: Diagnosis not present

## 2023-12-06 DIAGNOSIS — Z1152 Encounter for screening for COVID-19: Secondary | ICD-10-CM | POA: Diagnosis not present

## 2023-12-06 DIAGNOSIS — I5032 Chronic diastolic (congestive) heart failure: Secondary | ICD-10-CM | POA: Diagnosis not present

## 2023-12-06 DIAGNOSIS — F17210 Nicotine dependence, cigarettes, uncomplicated: Secondary | ICD-10-CM | POA: Diagnosis not present

## 2023-12-06 DIAGNOSIS — E1151 Type 2 diabetes mellitus with diabetic peripheral angiopathy without gangrene: Secondary | ICD-10-CM | POA: Diagnosis not present

## 2023-12-06 DIAGNOSIS — I2510 Atherosclerotic heart disease of native coronary artery without angina pectoris: Secondary | ICD-10-CM | POA: Diagnosis not present

## 2023-12-06 DIAGNOSIS — Z9981 Dependence on supplemental oxygen: Secondary | ICD-10-CM | POA: Diagnosis not present

## 2023-12-06 DIAGNOSIS — E1165 Type 2 diabetes mellitus with hyperglycemia: Secondary | ICD-10-CM | POA: Diagnosis not present

## 2023-12-06 DIAGNOSIS — J189 Pneumonia, unspecified organism: Secondary | ICD-10-CM | POA: Diagnosis not present

## 2023-12-06 DIAGNOSIS — K279 Peptic ulcer, site unspecified, unspecified as acute or chronic, without hemorrhage or perforation: Secondary | ICD-10-CM | POA: Diagnosis not present

## 2023-12-06 DIAGNOSIS — Z794 Long term (current) use of insulin: Secondary | ICD-10-CM | POA: Diagnosis not present

## 2023-12-06 DIAGNOSIS — F32A Depression, unspecified: Secondary | ICD-10-CM | POA: Diagnosis not present

## 2023-12-06 DIAGNOSIS — K219 Gastro-esophageal reflux disease without esophagitis: Secondary | ICD-10-CM | POA: Diagnosis not present

## 2023-12-06 DIAGNOSIS — J9601 Acute respiratory failure with hypoxia: Secondary | ICD-10-CM | POA: Diagnosis not present

## 2023-12-06 DIAGNOSIS — F1190 Opioid use, unspecified, uncomplicated: Secondary | ICD-10-CM | POA: Diagnosis not present

## 2023-12-06 DIAGNOSIS — E119 Type 2 diabetes mellitus without complications: Secondary | ICD-10-CM | POA: Diagnosis not present

## 2023-12-06 DIAGNOSIS — I10 Essential (primary) hypertension: Secondary | ICD-10-CM | POA: Diagnosis not present

## 2023-12-06 DIAGNOSIS — J90 Pleural effusion, not elsewhere classified: Secondary | ICD-10-CM | POA: Diagnosis not present

## 2023-12-06 DIAGNOSIS — Z7984 Long term (current) use of oral hypoglycemic drugs: Secondary | ICD-10-CM | POA: Diagnosis not present

## 2023-12-06 DIAGNOSIS — R0600 Dyspnea, unspecified: Secondary | ICD-10-CM | POA: Diagnosis not present

## 2023-12-06 DIAGNOSIS — I739 Peripheral vascular disease, unspecified: Secondary | ICD-10-CM | POA: Diagnosis not present

## 2023-12-06 DIAGNOSIS — J9602 Acute respiratory failure with hypercapnia: Secondary | ICD-10-CM | POA: Diagnosis not present

## 2023-12-08 DIAGNOSIS — I517 Cardiomegaly: Secondary | ICD-10-CM | POA: Diagnosis not present

## 2023-12-08 DIAGNOSIS — I340 Nonrheumatic mitral (valve) insufficiency: Secondary | ICD-10-CM | POA: Diagnosis not present

## 2023-12-08 DIAGNOSIS — I3481 Nonrheumatic mitral (valve) annulus calcification: Secondary | ICD-10-CM | POA: Diagnosis not present

## 2023-12-11 DIAGNOSIS — I10 Essential (primary) hypertension: Secondary | ICD-10-CM | POA: Diagnosis not present

## 2023-12-11 DIAGNOSIS — G8929 Other chronic pain: Secondary | ICD-10-CM | POA: Diagnosis not present

## 2023-12-11 DIAGNOSIS — R41841 Cognitive communication deficit: Secondary | ICD-10-CM | POA: Diagnosis not present

## 2023-12-11 DIAGNOSIS — E1151 Type 2 diabetes mellitus with diabetic peripheral angiopathy without gangrene: Secondary | ICD-10-CM | POA: Diagnosis not present

## 2023-12-11 DIAGNOSIS — R0602 Shortness of breath: Secondary | ICD-10-CM | POA: Diagnosis not present

## 2023-12-11 DIAGNOSIS — M62521 Muscle wasting and atrophy, not elsewhere classified, right upper arm: Secondary | ICD-10-CM | POA: Diagnosis not present

## 2023-12-11 DIAGNOSIS — G609 Hereditary and idiopathic neuropathy, unspecified: Secondary | ICD-10-CM | POA: Diagnosis not present

## 2023-12-11 DIAGNOSIS — J9621 Acute and chronic respiratory failure with hypoxia: Secondary | ICD-10-CM | POA: Diagnosis not present

## 2023-12-11 DIAGNOSIS — E785 Hyperlipidemia, unspecified: Secondary | ICD-10-CM | POA: Diagnosis not present

## 2023-12-11 DIAGNOSIS — J189 Pneumonia, unspecified organism: Secondary | ICD-10-CM | POA: Diagnosis not present

## 2023-12-11 DIAGNOSIS — E1165 Type 2 diabetes mellitus with hyperglycemia: Secondary | ICD-10-CM | POA: Diagnosis not present

## 2023-12-11 DIAGNOSIS — R2689 Other abnormalities of gait and mobility: Secondary | ICD-10-CM | POA: Diagnosis not present

## 2023-12-11 DIAGNOSIS — F1190 Opioid use, unspecified, uncomplicated: Secondary | ICD-10-CM | POA: Diagnosis not present

## 2023-12-11 DIAGNOSIS — M059 Rheumatoid arthritis with rheumatoid factor, unspecified: Secondary | ICD-10-CM | POA: Diagnosis not present

## 2023-12-11 DIAGNOSIS — M62561 Muscle wasting and atrophy, not elsewhere classified, right lower leg: Secondary | ICD-10-CM | POA: Diagnosis not present

## 2023-12-11 DIAGNOSIS — J449 Chronic obstructive pulmonary disease, unspecified: Secondary | ICD-10-CM | POA: Diagnosis not present

## 2023-12-11 DIAGNOSIS — I5032 Chronic diastolic (congestive) heart failure: Secondary | ICD-10-CM | POA: Diagnosis not present

## 2023-12-11 DIAGNOSIS — I25118 Atherosclerotic heart disease of native coronary artery with other forms of angina pectoris: Secondary | ICD-10-CM | POA: Diagnosis not present

## 2023-12-11 DIAGNOSIS — K219 Gastro-esophageal reflux disease without esophagitis: Secondary | ICD-10-CM | POA: Diagnosis not present

## 2023-12-11 DIAGNOSIS — Z9981 Dependence on supplemental oxygen: Secondary | ICD-10-CM | POA: Diagnosis not present

## 2023-12-11 DIAGNOSIS — F32A Depression, unspecified: Secondary | ICD-10-CM | POA: Diagnosis not present

## 2023-12-11 DIAGNOSIS — I739 Peripheral vascular disease, unspecified: Secondary | ICD-10-CM | POA: Diagnosis not present

## 2023-12-11 DIAGNOSIS — I5022 Chronic systolic (congestive) heart failure: Secondary | ICD-10-CM | POA: Diagnosis not present

## 2023-12-11 DIAGNOSIS — J440 Chronic obstructive pulmonary disease with acute lower respiratory infection: Secondary | ICD-10-CM | POA: Diagnosis not present

## 2023-12-11 DIAGNOSIS — K279 Peptic ulcer, site unspecified, unspecified as acute or chronic, without hemorrhage or perforation: Secondary | ICD-10-CM | POA: Diagnosis not present

## 2023-12-11 DIAGNOSIS — E119 Type 2 diabetes mellitus without complications: Secondary | ICD-10-CM | POA: Diagnosis not present

## 2023-12-11 DIAGNOSIS — I429 Cardiomyopathy, unspecified: Secondary | ICD-10-CM | POA: Diagnosis not present

## 2023-12-11 DIAGNOSIS — Z89512 Acquired absence of left leg below knee: Secondary | ICD-10-CM | POA: Diagnosis not present

## 2023-12-11 DIAGNOSIS — M5090 Cervical disc disorder, unspecified, unspecified cervical region: Secondary | ICD-10-CM | POA: Diagnosis not present

## 2023-12-11 DIAGNOSIS — Z7984 Long term (current) use of oral hypoglycemic drugs: Secondary | ICD-10-CM | POA: Diagnosis not present

## 2023-12-11 MED ORDER — TUBERCULIN PPD 5 TUB. UNIT/0.1 ML INTRADERMAL INJECTION SOLUTION
INTRADERMAL | PRN refills | 1 days | Status: CP
Start: 2023-12-11 — End: 2024-01-08

## 2023-12-11 MED ORDER — AMITRIPTYLINE 10 MG TABLET
ORAL_TABLET | Freq: Every evening | ORAL | PRN refills | 1 days | Status: CP
Start: 2023-12-11 — End: ?

## 2023-12-11 MED ORDER — METFORMIN 1,000 MG TABLET
ORAL_TABLET | Freq: Two times a day (BID) | ORAL | PRN refills | 1 days | Status: CP
Start: 2023-12-11 — End: ?

## 2023-12-11 MED ORDER — CARVEDILOL 6.25 MG TABLET
ORAL_TABLET | Freq: Two times a day (BID) | ORAL | PRN refills | 1 days | Status: CP
Start: 2023-12-11 — End: ?

## 2023-12-11 MED ORDER — PRAVASTATIN 80 MG TABLET
ORAL_TABLET | Freq: Every day | ORAL | PRN refills | 1 days | Status: CP
Start: 2023-12-11 — End: ?

## 2023-12-11 MED ORDER — GABAPENTIN 400 MG CAPSULE
ORAL_CAPSULE | Freq: Two times a day (BID) | ORAL | PRN refills | 1 days | Status: CP
Start: 2023-12-11 — End: ?

## 2023-12-11 MED ORDER — OXYCODONE-ACETAMINOPHEN 10 MG-325 MG TABLET
ORAL_TABLET | Freq: Four times a day (QID) | ORAL | 0 refills | 5 days | Status: CP | PRN
Start: 2023-12-11 — End: 2023-12-16

## 2023-12-11 MED ORDER — ALBUTEROL SULFATE HFA 90 MCG/ACTUATION AEROSOL INHALER
RESPIRATORY_TRACT | PRN refills | 0 days | Status: CP | PRN
Start: 2023-12-11 — End: ?

## 2023-12-11 MED ORDER — OXYCODONE 5 MG TABLET
ORAL_TABLET | Freq: Three times a day (TID) | ORAL | 0 refills | 5 days | Status: CN | PRN
Start: 2023-12-11 — End: 2023-12-16

## 2023-12-11 MED ORDER — ATORVASTATIN 20 MG TABLET
ORAL_TABLET | Freq: Every day | ORAL | PRN refills | 1 days | Status: DC
Start: 2023-12-11 — End: 2023-12-11

## 2023-12-11 MED ORDER — AMLODIPINE 5 MG TABLET
ORAL_TABLET | Freq: Every day | ORAL | PRN refills | 1 days | Status: DC
Start: 2023-12-11 — End: 2023-12-11

## 2023-12-12 DIAGNOSIS — I739 Peripheral vascular disease, unspecified: Secondary | ICD-10-CM | POA: Diagnosis not present

## 2023-12-12 DIAGNOSIS — E1165 Type 2 diabetes mellitus with hyperglycemia: Secondary | ICD-10-CM | POA: Diagnosis not present

## 2023-12-12 MED ORDER — PANTOPRAZOLE 40 MG TABLET,DELAYED RELEASE
ORAL_TABLET | Freq: Every day | ORAL | PRN refills | 1 days | Status: DC
Start: 2023-12-12 — End: 2023-12-11

## 2023-12-12 MED ORDER — ASPIRIN 81 MG TABLET,DELAYED RELEASE
ORAL_TABLET | Freq: Every day | ORAL | PRN refills | 1 days | Status: CP
Start: 2023-12-12 — End: ?

## 2023-12-12 MED ORDER — OXYCODONE-ACETAMINOPHEN 10 MG-325 MG TABLET
ORAL_TABLET | Freq: Four times a day (QID) | ORAL | 0 refills | 30 days | Status: CP
Start: 2023-12-12 — End: 2024-01-11

## 2023-12-12 MED ORDER — POLYETHYLENE GLYCOL 3350 17 GRAM ORAL POWDER PACKET
PACK | Freq: Every day | ORAL | PRN refills | 1 days | Status: DC
Start: 2023-12-12 — End: 2023-12-12

## 2023-12-12 MED ORDER — AMLODIPINE 5 MG TABLET
ORAL_TABLET | Freq: Every day | ORAL | PRN refills | 1 days
Start: 2023-12-12 — End: ?

## 2023-12-12 MED ORDER — OMEPRAZOLE 40 MG CAPSULE,DELAYED RELEASE
ORAL_CAPSULE | Freq: Every day | ORAL | PRN refills | 1 days | Status: CP
Start: 2023-12-12 — End: ?

## 2023-12-12 MED ORDER — MELATONIN 3 MG TABLET
ORAL_TABLET | Freq: Every evening | ORAL | PRN refills | 1 days | Status: DC
Start: 2023-12-12 — End: 2023-12-12

## 2023-12-12 MED ORDER — FLUTICASONE FUR. 100 MCG-UMECLID 62.5 MCG-VILANT 25 MCG INHALAT.POWDER
Freq: Every day | RESPIRATORY_TRACT | PRN refills | 1 days | Status: DC
Start: 2023-12-12 — End: 2023-12-12

## 2023-12-12 MED ORDER — DULOXETINE 60 MG CAPSULE,DELAYED RELEASE
ORAL_CAPSULE | Freq: Every day | ORAL | PRN refills | 1 days | Status: CP
Start: 2023-12-12 — End: ?

## 2023-12-13 MED ORDER — ASPIRIN 81 MG TABLET,DELAYED RELEASE
ORAL_TABLET | Freq: Every day | ORAL | PRN refills | 1 days | Status: CP
Start: 2023-12-13 — End: ?

## 2023-12-13 MED ORDER — POLYETHYLENE GLYCOL 3350 17 GRAM/DOSE ORAL POWDER
Freq: Every day | ORAL | PRN refills | 1 days | Status: CP
Start: 2023-12-13 — End: ?

## 2023-12-14 MED ORDER — FLUTICASONE FUR. 100 MCG-UMECLID 62.5 MCG-VILANT 25 MCG INHALAT.POWDER
Freq: Every day | RESPIRATORY_TRACT | PRN refills | 1 days | Status: CP
Start: 2023-12-14 — End: ?

## 2023-12-16 ENCOUNTER — Inpatient Hospital Stay
Admit: 2023-12-16 | Discharge: 2023-12-31 | Disposition: A | Discharge status: Home Health | Payer: Medicare (Managed Care) | Source: Other Acute Inpatient Hospital

## 2023-12-17 MED ORDER — ZINC OXIDE 17 %-WHITE PETROLATUM 57 % TOPICAL PASTE
Freq: Four times a day (QID) | TOPICAL | PRN refills | 0 days | Status: CP
Start: 2023-12-17 — End: ?

## 2023-12-23 DIAGNOSIS — E1165 Type 2 diabetes mellitus with hyperglycemia: Secondary | ICD-10-CM | POA: Diagnosis not present

## 2023-12-23 DIAGNOSIS — I5032 Chronic diastolic (congestive) heart failure: Secondary | ICD-10-CM | POA: Diagnosis not present

## 2023-12-23 DIAGNOSIS — J440 Chronic obstructive pulmonary disease with acute lower respiratory infection: Secondary | ICD-10-CM | POA: Diagnosis not present

## 2023-12-24 MED ORDER — DOXYCYCLINE HYCLATE 100 MG CAPSULE
ORAL_CAPSULE | Freq: Two times a day (BID) | ORAL | PRN refills | 1.00 days | Status: CP
Start: 2023-12-24 — End: 2023-12-29

## 2023-12-24 MED ORDER — IPRATROPIUM 0.5 MG-ALBUTEROL 3 MG (2.5 MG BASE)/3 ML NEBULIZATION SOLN
Freq: Once | RESPIRATORY_TRACT | PRN refills | 0.00 days | Status: CP
Start: 2023-12-24 — End: 2023-12-24

## 2023-12-24 MED ORDER — PREDNISONE 50 MG TABLET
ORAL_TABLET | Freq: Every day | ORAL | PRN refills | 1.00 days | Status: CP
Start: 2023-12-24 — End: 2023-12-29

## 2023-12-29 MED ORDER — POLYETHYLENE GLYCOL 3350 17 GRAM/DOSE ORAL POWDER
Freq: Every day | ORAL | PRN refills | 1.00 days | Status: DC
Start: 2023-12-29 — End: 2023-12-29

## 2023-12-31 DIAGNOSIS — I5032 Chronic diastolic (congestive) heart failure: Secondary | ICD-10-CM | POA: Diagnosis not present

## 2023-12-31 DIAGNOSIS — E1165 Type 2 diabetes mellitus with hyperglycemia: Secondary | ICD-10-CM | POA: Diagnosis not present

## 2023-12-31 DIAGNOSIS — J440 Chronic obstructive pulmonary disease with acute lower respiratory infection: Secondary | ICD-10-CM | POA: Diagnosis not present

## 2023-12-31 MED ORDER — OXYCODONE-ACETAMINOPHEN 10 MG-325 MG TABLET
ORAL_TABLET | Freq: Four times a day (QID) | ORAL | 0 refills | 5.00 days | Status: CP
Start: 2023-12-31 — End: 2024-01-05

## 2024-01-01 DIAGNOSIS — D649 Anemia, unspecified: Secondary | ICD-10-CM | POA: Diagnosis not present

## 2024-01-01 DIAGNOSIS — E1142 Type 2 diabetes mellitus with diabetic polyneuropathy: Secondary | ICD-10-CM | POA: Diagnosis not present

## 2024-01-01 DIAGNOSIS — E1151 Type 2 diabetes mellitus with diabetic peripheral angiopathy without gangrene: Secondary | ICD-10-CM | POA: Diagnosis not present

## 2024-01-01 DIAGNOSIS — Z89512 Acquired absence of left leg below knee: Secondary | ICD-10-CM | POA: Diagnosis not present

## 2024-01-01 DIAGNOSIS — J9611 Chronic respiratory failure with hypoxia: Secondary | ICD-10-CM | POA: Diagnosis not present

## 2024-01-01 DIAGNOSIS — T402X5D Adverse effect of other opioids, subsequent encounter: Secondary | ICD-10-CM | POA: Diagnosis not present

## 2024-01-01 DIAGNOSIS — Z8701 Personal history of pneumonia (recurrent): Secondary | ICD-10-CM | POA: Diagnosis not present

## 2024-01-01 DIAGNOSIS — Z9981 Dependence on supplemental oxygen: Secondary | ICD-10-CM | POA: Diagnosis not present

## 2024-01-01 DIAGNOSIS — F331 Major depressive disorder, recurrent, moderate: Secondary | ICD-10-CM | POA: Diagnosis not present

## 2024-01-01 DIAGNOSIS — I340 Nonrheumatic mitral (valve) insufficiency: Secondary | ICD-10-CM | POA: Diagnosis not present

## 2024-01-01 DIAGNOSIS — J9612 Chronic respiratory failure with hypercapnia: Secondary | ICD-10-CM | POA: Diagnosis not present

## 2024-01-01 DIAGNOSIS — Z7984 Long term (current) use of oral hypoglycemic drugs: Secondary | ICD-10-CM | POA: Diagnosis not present

## 2024-01-01 DIAGNOSIS — Z7982 Long term (current) use of aspirin: Secondary | ICD-10-CM | POA: Diagnosis not present

## 2024-01-01 DIAGNOSIS — K5903 Drug induced constipation: Secondary | ICD-10-CM | POA: Diagnosis not present

## 2024-01-01 DIAGNOSIS — I25118 Atherosclerotic heart disease of native coronary artery with other forms of angina pectoris: Secondary | ICD-10-CM | POA: Diagnosis not present

## 2024-01-01 DIAGNOSIS — I110 Hypertensive heart disease with heart failure: Secondary | ICD-10-CM | POA: Diagnosis not present

## 2024-01-01 DIAGNOSIS — Z7951 Long term (current) use of inhaled steroids: Secondary | ICD-10-CM | POA: Diagnosis not present

## 2024-01-01 DIAGNOSIS — E669 Obesity, unspecified: Secondary | ICD-10-CM | POA: Diagnosis not present

## 2024-01-01 DIAGNOSIS — M0550 Rheumatoid polyneuropathy with rheumatoid arthritis of unspecified site: Secondary | ICD-10-CM | POA: Diagnosis not present

## 2024-01-01 DIAGNOSIS — I70203 Unspecified atherosclerosis of native arteries of extremities, bilateral legs: Secondary | ICD-10-CM | POA: Diagnosis not present

## 2024-01-01 DIAGNOSIS — F17210 Nicotine dependence, cigarettes, uncomplicated: Secondary | ICD-10-CM | POA: Diagnosis not present

## 2024-01-01 DIAGNOSIS — I5042 Chronic combined systolic (congestive) and diastolic (congestive) heart failure: Secondary | ICD-10-CM | POA: Diagnosis not present

## 2024-01-01 DIAGNOSIS — Z6831 Body mass index (BMI) 31.0-31.9, adult: Secondary | ICD-10-CM | POA: Diagnosis not present

## 2024-01-01 DIAGNOSIS — J441 Chronic obstructive pulmonary disease with (acute) exacerbation: Secondary | ICD-10-CM | POA: Diagnosis not present

## 2024-01-01 MED ORDER — FLUTICASONE FUR. 100 MCG-UMECLID 62.5 MCG-VILANT 25 MCG INHALAT.POWDER
Freq: Every day | RESPIRATORY_TRACT | PRN refills | 30.00 days | Status: CP
Start: 2024-01-01 — End: ?

## 2024-01-04 DIAGNOSIS — I509 Heart failure, unspecified: Secondary | ICD-10-CM | POA: Diagnosis not present

## 2024-01-06 DIAGNOSIS — E1151 Type 2 diabetes mellitus with diabetic peripheral angiopathy without gangrene: Secondary | ICD-10-CM | POA: Diagnosis not present

## 2024-01-06 DIAGNOSIS — M0550 Rheumatoid polyneuropathy with rheumatoid arthritis of unspecified site: Secondary | ICD-10-CM | POA: Diagnosis not present

## 2024-01-06 DIAGNOSIS — I70203 Unspecified atherosclerosis of native arteries of extremities, bilateral legs: Secondary | ICD-10-CM | POA: Diagnosis not present

## 2024-01-06 DIAGNOSIS — Z89512 Acquired absence of left leg below knee: Secondary | ICD-10-CM | POA: Diagnosis not present

## 2024-01-06 DIAGNOSIS — Z7984 Long term (current) use of oral hypoglycemic drugs: Secondary | ICD-10-CM | POA: Diagnosis not present

## 2024-01-06 DIAGNOSIS — Z7982 Long term (current) use of aspirin: Secondary | ICD-10-CM | POA: Diagnosis not present

## 2024-01-06 DIAGNOSIS — E1142 Type 2 diabetes mellitus with diabetic polyneuropathy: Secondary | ICD-10-CM | POA: Diagnosis not present

## 2024-01-06 DIAGNOSIS — I25118 Atherosclerotic heart disease of native coronary artery with other forms of angina pectoris: Secondary | ICD-10-CM | POA: Diagnosis not present

## 2024-01-06 DIAGNOSIS — Z8701 Personal history of pneumonia (recurrent): Secondary | ICD-10-CM | POA: Diagnosis not present

## 2024-01-06 DIAGNOSIS — F17210 Nicotine dependence, cigarettes, uncomplicated: Secondary | ICD-10-CM | POA: Diagnosis not present

## 2024-01-06 DIAGNOSIS — Z6831 Body mass index (BMI) 31.0-31.9, adult: Secondary | ICD-10-CM | POA: Diagnosis not present

## 2024-01-06 DIAGNOSIS — J441 Chronic obstructive pulmonary disease with (acute) exacerbation: Secondary | ICD-10-CM | POA: Diagnosis not present

## 2024-01-06 DIAGNOSIS — Z9981 Dependence on supplemental oxygen: Secondary | ICD-10-CM | POA: Diagnosis not present

## 2024-01-06 DIAGNOSIS — I110 Hypertensive heart disease with heart failure: Secondary | ICD-10-CM | POA: Diagnosis not present

## 2024-01-06 DIAGNOSIS — I5042 Chronic combined systolic (congestive) and diastolic (congestive) heart failure: Secondary | ICD-10-CM | POA: Diagnosis not present

## 2024-01-06 DIAGNOSIS — E669 Obesity, unspecified: Secondary | ICD-10-CM | POA: Diagnosis not present

## 2024-01-06 DIAGNOSIS — D649 Anemia, unspecified: Secondary | ICD-10-CM | POA: Diagnosis not present

## 2024-01-06 DIAGNOSIS — F331 Major depressive disorder, recurrent, moderate: Secondary | ICD-10-CM | POA: Diagnosis not present

## 2024-01-06 DIAGNOSIS — I340 Nonrheumatic mitral (valve) insufficiency: Secondary | ICD-10-CM | POA: Diagnosis not present

## 2024-01-06 DIAGNOSIS — Z7951 Long term (current) use of inhaled steroids: Secondary | ICD-10-CM | POA: Diagnosis not present

## 2024-01-06 DIAGNOSIS — K5903 Drug induced constipation: Secondary | ICD-10-CM | POA: Diagnosis not present

## 2024-01-06 DIAGNOSIS — J9611 Chronic respiratory failure with hypoxia: Secondary | ICD-10-CM | POA: Diagnosis not present

## 2024-01-06 DIAGNOSIS — T402X5D Adverse effect of other opioids, subsequent encounter: Secondary | ICD-10-CM | POA: Diagnosis not present

## 2024-01-06 DIAGNOSIS — J9612 Chronic respiratory failure with hypercapnia: Secondary | ICD-10-CM | POA: Diagnosis not present

## 2024-01-07 DIAGNOSIS — I25118 Atherosclerotic heart disease of native coronary artery with other forms of angina pectoris: Secondary | ICD-10-CM | POA: Diagnosis not present

## 2024-01-07 DIAGNOSIS — I340 Nonrheumatic mitral (valve) insufficiency: Secondary | ICD-10-CM | POA: Diagnosis not present

## 2024-01-07 DIAGNOSIS — M0550 Rheumatoid polyneuropathy with rheumatoid arthritis of unspecified site: Secondary | ICD-10-CM | POA: Diagnosis not present

## 2024-01-07 DIAGNOSIS — E1142 Type 2 diabetes mellitus with diabetic polyneuropathy: Secondary | ICD-10-CM | POA: Diagnosis not present

## 2024-01-07 DIAGNOSIS — J441 Chronic obstructive pulmonary disease with (acute) exacerbation: Secondary | ICD-10-CM | POA: Diagnosis not present

## 2024-01-07 DIAGNOSIS — F331 Major depressive disorder, recurrent, moderate: Secondary | ICD-10-CM | POA: Diagnosis not present

## 2024-01-07 DIAGNOSIS — J9612 Chronic respiratory failure with hypercapnia: Secondary | ICD-10-CM | POA: Diagnosis not present

## 2024-01-07 DIAGNOSIS — I110 Hypertensive heart disease with heart failure: Secondary | ICD-10-CM | POA: Diagnosis not present

## 2024-01-07 DIAGNOSIS — E1152 Type 2 diabetes mellitus with diabetic peripheral angiopathy with gangrene: Secondary | ICD-10-CM | POA: Diagnosis not present

## 2024-01-07 DIAGNOSIS — I5042 Chronic combined systolic (congestive) and diastolic (congestive) heart failure: Secondary | ICD-10-CM | POA: Diagnosis not present

## 2024-01-07 DIAGNOSIS — I70203 Unspecified atherosclerosis of native arteries of extremities, bilateral legs: Secondary | ICD-10-CM | POA: Diagnosis not present

## 2024-01-07 DIAGNOSIS — J9611 Chronic respiratory failure with hypoxia: Secondary | ICD-10-CM | POA: Diagnosis not present

## 2024-01-13 DIAGNOSIS — Z9981 Dependence on supplemental oxygen: Secondary | ICD-10-CM | POA: Diagnosis not present

## 2024-01-13 DIAGNOSIS — F17210 Nicotine dependence, cigarettes, uncomplicated: Secondary | ICD-10-CM | POA: Diagnosis not present

## 2024-01-13 DIAGNOSIS — J9612 Chronic respiratory failure with hypercapnia: Secondary | ICD-10-CM | POA: Diagnosis not present

## 2024-01-13 DIAGNOSIS — T402X5D Adverse effect of other opioids, subsequent encounter: Secondary | ICD-10-CM | POA: Diagnosis not present

## 2024-01-13 DIAGNOSIS — E669 Obesity, unspecified: Secondary | ICD-10-CM | POA: Diagnosis not present

## 2024-01-13 DIAGNOSIS — Z89512 Acquired absence of left leg below knee: Secondary | ICD-10-CM | POA: Diagnosis not present

## 2024-01-13 DIAGNOSIS — E1151 Type 2 diabetes mellitus with diabetic peripheral angiopathy without gangrene: Secondary | ICD-10-CM | POA: Diagnosis not present

## 2024-01-13 DIAGNOSIS — J9611 Chronic respiratory failure with hypoxia: Secondary | ICD-10-CM | POA: Diagnosis not present

## 2024-01-13 DIAGNOSIS — Z7982 Long term (current) use of aspirin: Secondary | ICD-10-CM | POA: Diagnosis not present

## 2024-01-13 DIAGNOSIS — I70203 Unspecified atherosclerosis of native arteries of extremities, bilateral legs: Secondary | ICD-10-CM | POA: Diagnosis not present

## 2024-01-13 DIAGNOSIS — M0550 Rheumatoid polyneuropathy with rheumatoid arthritis of unspecified site: Secondary | ICD-10-CM | POA: Diagnosis not present

## 2024-01-13 DIAGNOSIS — F331 Major depressive disorder, recurrent, moderate: Secondary | ICD-10-CM | POA: Diagnosis not present

## 2024-01-13 DIAGNOSIS — J441 Chronic obstructive pulmonary disease with (acute) exacerbation: Secondary | ICD-10-CM | POA: Diagnosis not present

## 2024-01-13 DIAGNOSIS — I25118 Atherosclerotic heart disease of native coronary artery with other forms of angina pectoris: Secondary | ICD-10-CM | POA: Diagnosis not present

## 2024-01-13 DIAGNOSIS — D649 Anemia, unspecified: Secondary | ICD-10-CM | POA: Diagnosis not present

## 2024-01-13 DIAGNOSIS — Z8701 Personal history of pneumonia (recurrent): Secondary | ICD-10-CM | POA: Diagnosis not present

## 2024-01-13 DIAGNOSIS — E1142 Type 2 diabetes mellitus with diabetic polyneuropathy: Secondary | ICD-10-CM | POA: Diagnosis not present

## 2024-01-13 DIAGNOSIS — Z7951 Long term (current) use of inhaled steroids: Secondary | ICD-10-CM | POA: Diagnosis not present

## 2024-01-13 DIAGNOSIS — I110 Hypertensive heart disease with heart failure: Secondary | ICD-10-CM | POA: Diagnosis not present

## 2024-01-13 DIAGNOSIS — Z7984 Long term (current) use of oral hypoglycemic drugs: Secondary | ICD-10-CM | POA: Diagnosis not present

## 2024-01-13 DIAGNOSIS — K5903 Drug induced constipation: Secondary | ICD-10-CM | POA: Diagnosis not present

## 2024-01-13 DIAGNOSIS — I5042 Chronic combined systolic (congestive) and diastolic (congestive) heart failure: Secondary | ICD-10-CM | POA: Diagnosis not present

## 2024-01-13 DIAGNOSIS — Z6831 Body mass index (BMI) 31.0-31.9, adult: Secondary | ICD-10-CM | POA: Diagnosis not present

## 2024-01-13 DIAGNOSIS — I340 Nonrheumatic mitral (valve) insufficiency: Secondary | ICD-10-CM | POA: Diagnosis not present

## 2024-01-14 DIAGNOSIS — Z7984 Long term (current) use of oral hypoglycemic drugs: Secondary | ICD-10-CM | POA: Diagnosis not present

## 2024-01-14 DIAGNOSIS — I340 Nonrheumatic mitral (valve) insufficiency: Secondary | ICD-10-CM | POA: Diagnosis not present

## 2024-01-14 DIAGNOSIS — I110 Hypertensive heart disease with heart failure: Secondary | ICD-10-CM | POA: Diagnosis not present

## 2024-01-14 DIAGNOSIS — Z6831 Body mass index (BMI) 31.0-31.9, adult: Secondary | ICD-10-CM | POA: Diagnosis not present

## 2024-01-14 DIAGNOSIS — E1151 Type 2 diabetes mellitus with diabetic peripheral angiopathy without gangrene: Secondary | ICD-10-CM | POA: Diagnosis not present

## 2024-01-14 DIAGNOSIS — Z8701 Personal history of pneumonia (recurrent): Secondary | ICD-10-CM | POA: Diagnosis not present

## 2024-01-14 DIAGNOSIS — Z89512 Acquired absence of left leg below knee: Secondary | ICD-10-CM | POA: Diagnosis not present

## 2024-01-14 DIAGNOSIS — F17210 Nicotine dependence, cigarettes, uncomplicated: Secondary | ICD-10-CM | POA: Diagnosis not present

## 2024-01-14 DIAGNOSIS — E1142 Type 2 diabetes mellitus with diabetic polyneuropathy: Secondary | ICD-10-CM | POA: Diagnosis not present

## 2024-01-14 DIAGNOSIS — I25118 Atherosclerotic heart disease of native coronary artery with other forms of angina pectoris: Secondary | ICD-10-CM | POA: Diagnosis not present

## 2024-01-14 DIAGNOSIS — I70203 Unspecified atherosclerosis of native arteries of extremities, bilateral legs: Secondary | ICD-10-CM | POA: Diagnosis not present

## 2024-01-14 DIAGNOSIS — Z9981 Dependence on supplemental oxygen: Secondary | ICD-10-CM | POA: Diagnosis not present

## 2024-01-14 DIAGNOSIS — J9611 Chronic respiratory failure with hypoxia: Secondary | ICD-10-CM | POA: Diagnosis not present

## 2024-01-14 DIAGNOSIS — T402X5D Adverse effect of other opioids, subsequent encounter: Secondary | ICD-10-CM | POA: Diagnosis not present

## 2024-01-14 DIAGNOSIS — M0550 Rheumatoid polyneuropathy with rheumatoid arthritis of unspecified site: Secondary | ICD-10-CM | POA: Diagnosis not present

## 2024-01-14 DIAGNOSIS — J441 Chronic obstructive pulmonary disease with (acute) exacerbation: Secondary | ICD-10-CM | POA: Diagnosis not present

## 2024-01-14 DIAGNOSIS — Z7982 Long term (current) use of aspirin: Secondary | ICD-10-CM | POA: Diagnosis not present

## 2024-01-14 DIAGNOSIS — I5042 Chronic combined systolic (congestive) and diastolic (congestive) heart failure: Secondary | ICD-10-CM | POA: Diagnosis not present

## 2024-01-14 DIAGNOSIS — F331 Major depressive disorder, recurrent, moderate: Secondary | ICD-10-CM | POA: Diagnosis not present

## 2024-01-14 DIAGNOSIS — J9612 Chronic respiratory failure with hypercapnia: Secondary | ICD-10-CM | POA: Diagnosis not present

## 2024-01-14 DIAGNOSIS — D649 Anemia, unspecified: Secondary | ICD-10-CM | POA: Diagnosis not present

## 2024-01-14 DIAGNOSIS — Z7951 Long term (current) use of inhaled steroids: Secondary | ICD-10-CM | POA: Diagnosis not present

## 2024-01-14 DIAGNOSIS — K5903 Drug induced constipation: Secondary | ICD-10-CM | POA: Diagnosis not present

## 2024-01-14 DIAGNOSIS — E669 Obesity, unspecified: Secondary | ICD-10-CM | POA: Diagnosis not present

## 2024-01-16 DIAGNOSIS — E669 Obesity, unspecified: Secondary | ICD-10-CM | POA: Diagnosis not present

## 2024-01-16 DIAGNOSIS — Z7982 Long term (current) use of aspirin: Secondary | ICD-10-CM | POA: Diagnosis not present

## 2024-01-16 DIAGNOSIS — Z7984 Long term (current) use of oral hypoglycemic drugs: Secondary | ICD-10-CM | POA: Diagnosis not present

## 2024-01-16 DIAGNOSIS — E1142 Type 2 diabetes mellitus with diabetic polyneuropathy: Secondary | ICD-10-CM | POA: Diagnosis not present

## 2024-01-16 DIAGNOSIS — J9612 Chronic respiratory failure with hypercapnia: Secondary | ICD-10-CM | POA: Diagnosis not present

## 2024-01-16 DIAGNOSIS — D649 Anemia, unspecified: Secondary | ICD-10-CM | POA: Diagnosis not present

## 2024-01-16 DIAGNOSIS — I110 Hypertensive heart disease with heart failure: Secondary | ICD-10-CM | POA: Diagnosis not present

## 2024-01-16 DIAGNOSIS — I5042 Chronic combined systolic (congestive) and diastolic (congestive) heart failure: Secondary | ICD-10-CM | POA: Diagnosis not present

## 2024-01-16 DIAGNOSIS — I25118 Atherosclerotic heart disease of native coronary artery with other forms of angina pectoris: Secondary | ICD-10-CM | POA: Diagnosis not present

## 2024-01-16 DIAGNOSIS — J441 Chronic obstructive pulmonary disease with (acute) exacerbation: Secondary | ICD-10-CM | POA: Diagnosis not present

## 2024-01-16 DIAGNOSIS — K5903 Drug induced constipation: Secondary | ICD-10-CM | POA: Diagnosis not present

## 2024-01-16 DIAGNOSIS — Z9981 Dependence on supplemental oxygen: Secondary | ICD-10-CM | POA: Diagnosis not present

## 2024-01-16 DIAGNOSIS — I340 Nonrheumatic mitral (valve) insufficiency: Secondary | ICD-10-CM | POA: Diagnosis not present

## 2024-01-16 DIAGNOSIS — Z7951 Long term (current) use of inhaled steroids: Secondary | ICD-10-CM | POA: Diagnosis not present

## 2024-01-16 DIAGNOSIS — F331 Major depressive disorder, recurrent, moderate: Secondary | ICD-10-CM | POA: Diagnosis not present

## 2024-01-16 DIAGNOSIS — T402X5D Adverse effect of other opioids, subsequent encounter: Secondary | ICD-10-CM | POA: Diagnosis not present

## 2024-01-16 DIAGNOSIS — F17210 Nicotine dependence, cigarettes, uncomplicated: Secondary | ICD-10-CM | POA: Diagnosis not present

## 2024-01-16 DIAGNOSIS — I70203 Unspecified atherosclerosis of native arteries of extremities, bilateral legs: Secondary | ICD-10-CM | POA: Diagnosis not present

## 2024-01-16 DIAGNOSIS — M0550 Rheumatoid polyneuropathy with rheumatoid arthritis of unspecified site: Secondary | ICD-10-CM | POA: Diagnosis not present

## 2024-01-16 DIAGNOSIS — Z89512 Acquired absence of left leg below knee: Secondary | ICD-10-CM | POA: Diagnosis not present

## 2024-01-16 DIAGNOSIS — Z8701 Personal history of pneumonia (recurrent): Secondary | ICD-10-CM | POA: Diagnosis not present

## 2024-01-16 DIAGNOSIS — E1151 Type 2 diabetes mellitus with diabetic peripheral angiopathy without gangrene: Secondary | ICD-10-CM | POA: Diagnosis not present

## 2024-01-16 DIAGNOSIS — Z6831 Body mass index (BMI) 31.0-31.9, adult: Secondary | ICD-10-CM | POA: Diagnosis not present

## 2024-01-16 DIAGNOSIS — J9611 Chronic respiratory failure with hypoxia: Secondary | ICD-10-CM | POA: Diagnosis not present

## 2024-01-22 DIAGNOSIS — I70203 Unspecified atherosclerosis of native arteries of extremities, bilateral legs: Secondary | ICD-10-CM | POA: Diagnosis not present

## 2024-01-22 DIAGNOSIS — K5903 Drug induced constipation: Secondary | ICD-10-CM | POA: Diagnosis not present

## 2024-01-22 DIAGNOSIS — Z7984 Long term (current) use of oral hypoglycemic drugs: Secondary | ICD-10-CM | POA: Diagnosis not present

## 2024-01-22 DIAGNOSIS — I340 Nonrheumatic mitral (valve) insufficiency: Secondary | ICD-10-CM | POA: Diagnosis not present

## 2024-01-22 DIAGNOSIS — I110 Hypertensive heart disease with heart failure: Secondary | ICD-10-CM | POA: Diagnosis not present

## 2024-01-22 DIAGNOSIS — M0550 Rheumatoid polyneuropathy with rheumatoid arthritis of unspecified site: Secondary | ICD-10-CM | POA: Diagnosis not present

## 2024-01-22 DIAGNOSIS — Z7982 Long term (current) use of aspirin: Secondary | ICD-10-CM | POA: Diagnosis not present

## 2024-01-22 DIAGNOSIS — F17210 Nicotine dependence, cigarettes, uncomplicated: Secondary | ICD-10-CM | POA: Diagnosis not present

## 2024-01-22 DIAGNOSIS — Z89512 Acquired absence of left leg below knee: Secondary | ICD-10-CM | POA: Diagnosis not present

## 2024-01-22 DIAGNOSIS — Z6831 Body mass index (BMI) 31.0-31.9, adult: Secondary | ICD-10-CM | POA: Diagnosis not present

## 2024-01-22 DIAGNOSIS — J441 Chronic obstructive pulmonary disease with (acute) exacerbation: Secondary | ICD-10-CM | POA: Diagnosis not present

## 2024-01-22 DIAGNOSIS — Z7951 Long term (current) use of inhaled steroids: Secondary | ICD-10-CM | POA: Diagnosis not present

## 2024-01-22 DIAGNOSIS — I25118 Atherosclerotic heart disease of native coronary artery with other forms of angina pectoris: Secondary | ICD-10-CM | POA: Diagnosis not present

## 2024-01-22 DIAGNOSIS — D649 Anemia, unspecified: Secondary | ICD-10-CM | POA: Diagnosis not present

## 2024-01-22 DIAGNOSIS — J9611 Chronic respiratory failure with hypoxia: Secondary | ICD-10-CM | POA: Diagnosis not present

## 2024-01-22 DIAGNOSIS — E669 Obesity, unspecified: Secondary | ICD-10-CM | POA: Diagnosis not present

## 2024-01-22 DIAGNOSIS — J9612 Chronic respiratory failure with hypercapnia: Secondary | ICD-10-CM | POA: Diagnosis not present

## 2024-01-22 DIAGNOSIS — E1142 Type 2 diabetes mellitus with diabetic polyneuropathy: Secondary | ICD-10-CM | POA: Diagnosis not present

## 2024-01-22 DIAGNOSIS — Z9981 Dependence on supplemental oxygen: Secondary | ICD-10-CM | POA: Diagnosis not present

## 2024-01-22 DIAGNOSIS — Z8701 Personal history of pneumonia (recurrent): Secondary | ICD-10-CM | POA: Diagnosis not present

## 2024-01-22 DIAGNOSIS — T402X5D Adverse effect of other opioids, subsequent encounter: Secondary | ICD-10-CM | POA: Diagnosis not present

## 2024-01-22 DIAGNOSIS — E1151 Type 2 diabetes mellitus with diabetic peripheral angiopathy without gangrene: Secondary | ICD-10-CM | POA: Diagnosis not present

## 2024-01-22 DIAGNOSIS — I5042 Chronic combined systolic (congestive) and diastolic (congestive) heart failure: Secondary | ICD-10-CM | POA: Diagnosis not present

## 2024-01-22 DIAGNOSIS — F331 Major depressive disorder, recurrent, moderate: Secondary | ICD-10-CM | POA: Diagnosis not present

## 2024-02-03 DIAGNOSIS — Z6831 Body mass index (BMI) 31.0-31.9, adult: Secondary | ICD-10-CM | POA: Diagnosis not present

## 2024-02-03 DIAGNOSIS — Z7982 Long term (current) use of aspirin: Secondary | ICD-10-CM | POA: Diagnosis not present

## 2024-02-03 DIAGNOSIS — I340 Nonrheumatic mitral (valve) insufficiency: Secondary | ICD-10-CM | POA: Diagnosis not present

## 2024-02-03 DIAGNOSIS — T402X5D Adverse effect of other opioids, subsequent encounter: Secondary | ICD-10-CM | POA: Diagnosis not present

## 2024-02-03 DIAGNOSIS — Z7984 Long term (current) use of oral hypoglycemic drugs: Secondary | ICD-10-CM | POA: Diagnosis not present

## 2024-02-03 DIAGNOSIS — Z7951 Long term (current) use of inhaled steroids: Secondary | ICD-10-CM | POA: Diagnosis not present

## 2024-02-03 DIAGNOSIS — J9611 Chronic respiratory failure with hypoxia: Secondary | ICD-10-CM | POA: Diagnosis not present

## 2024-02-03 DIAGNOSIS — I509 Heart failure, unspecified: Secondary | ICD-10-CM | POA: Diagnosis not present

## 2024-02-03 DIAGNOSIS — K5903 Drug induced constipation: Secondary | ICD-10-CM | POA: Diagnosis not present

## 2024-02-03 DIAGNOSIS — I70203 Unspecified atherosclerosis of native arteries of extremities, bilateral legs: Secondary | ICD-10-CM | POA: Diagnosis not present

## 2024-02-03 DIAGNOSIS — I110 Hypertensive heart disease with heart failure: Secondary | ICD-10-CM | POA: Diagnosis not present

## 2024-02-03 DIAGNOSIS — F331 Major depressive disorder, recurrent, moderate: Secondary | ICD-10-CM | POA: Diagnosis not present

## 2024-02-03 DIAGNOSIS — E669 Obesity, unspecified: Secondary | ICD-10-CM | POA: Diagnosis not present

## 2024-02-03 DIAGNOSIS — M0550 Rheumatoid polyneuropathy with rheumatoid arthritis of unspecified site: Secondary | ICD-10-CM | POA: Diagnosis not present

## 2024-02-03 DIAGNOSIS — D649 Anemia, unspecified: Secondary | ICD-10-CM | POA: Diagnosis not present

## 2024-02-03 DIAGNOSIS — Z89512 Acquired absence of left leg below knee: Secondary | ICD-10-CM | POA: Diagnosis not present

## 2024-02-03 DIAGNOSIS — I25118 Atherosclerotic heart disease of native coronary artery with other forms of angina pectoris: Secondary | ICD-10-CM | POA: Diagnosis not present

## 2024-02-03 DIAGNOSIS — E1151 Type 2 diabetes mellitus with diabetic peripheral angiopathy without gangrene: Secondary | ICD-10-CM | POA: Diagnosis not present

## 2024-02-03 DIAGNOSIS — Z9981 Dependence on supplemental oxygen: Secondary | ICD-10-CM | POA: Diagnosis not present

## 2024-02-03 DIAGNOSIS — Z8701 Personal history of pneumonia (recurrent): Secondary | ICD-10-CM | POA: Diagnosis not present

## 2024-02-03 DIAGNOSIS — F17210 Nicotine dependence, cigarettes, uncomplicated: Secondary | ICD-10-CM | POA: Diagnosis not present

## 2024-02-03 DIAGNOSIS — E1142 Type 2 diabetes mellitus with diabetic polyneuropathy: Secondary | ICD-10-CM | POA: Diagnosis not present

## 2024-02-03 DIAGNOSIS — I5042 Chronic combined systolic (congestive) and diastolic (congestive) heart failure: Secondary | ICD-10-CM | POA: Diagnosis not present

## 2024-02-03 DIAGNOSIS — J441 Chronic obstructive pulmonary disease with (acute) exacerbation: Secondary | ICD-10-CM | POA: Diagnosis not present

## 2024-02-03 DIAGNOSIS — J9612 Chronic respiratory failure with hypercapnia: Secondary | ICD-10-CM | POA: Diagnosis not present

## 2024-02-05 DIAGNOSIS — Z6831 Body mass index (BMI) 31.0-31.9, adult: Secondary | ICD-10-CM | POA: Diagnosis not present

## 2024-02-05 DIAGNOSIS — E1142 Type 2 diabetes mellitus with diabetic polyneuropathy: Secondary | ICD-10-CM | POA: Diagnosis not present

## 2024-02-05 DIAGNOSIS — I25118 Atherosclerotic heart disease of native coronary artery with other forms of angina pectoris: Secondary | ICD-10-CM | POA: Diagnosis not present

## 2024-02-05 DIAGNOSIS — Z7984 Long term (current) use of oral hypoglycemic drugs: Secondary | ICD-10-CM | POA: Diagnosis not present

## 2024-02-05 DIAGNOSIS — E1151 Type 2 diabetes mellitus with diabetic peripheral angiopathy without gangrene: Secondary | ICD-10-CM | POA: Diagnosis not present

## 2024-02-05 DIAGNOSIS — T402X5D Adverse effect of other opioids, subsequent encounter: Secondary | ICD-10-CM | POA: Diagnosis not present

## 2024-02-05 DIAGNOSIS — I5042 Chronic combined systolic (congestive) and diastolic (congestive) heart failure: Secondary | ICD-10-CM | POA: Diagnosis not present

## 2024-02-05 DIAGNOSIS — Z89512 Acquired absence of left leg below knee: Secondary | ICD-10-CM | POA: Diagnosis not present

## 2024-02-05 DIAGNOSIS — J9612 Chronic respiratory failure with hypercapnia: Secondary | ICD-10-CM | POA: Diagnosis not present

## 2024-02-05 DIAGNOSIS — F17210 Nicotine dependence, cigarettes, uncomplicated: Secondary | ICD-10-CM | POA: Diagnosis not present

## 2024-02-05 DIAGNOSIS — E669 Obesity, unspecified: Secondary | ICD-10-CM | POA: Diagnosis not present

## 2024-02-05 DIAGNOSIS — Z7982 Long term (current) use of aspirin: Secondary | ICD-10-CM | POA: Diagnosis not present

## 2024-02-05 DIAGNOSIS — I110 Hypertensive heart disease with heart failure: Secondary | ICD-10-CM | POA: Diagnosis not present

## 2024-02-05 DIAGNOSIS — M0550 Rheumatoid polyneuropathy with rheumatoid arthritis of unspecified site: Secondary | ICD-10-CM | POA: Diagnosis not present

## 2024-02-05 DIAGNOSIS — D649 Anemia, unspecified: Secondary | ICD-10-CM | POA: Diagnosis not present

## 2024-02-05 DIAGNOSIS — K5903 Drug induced constipation: Secondary | ICD-10-CM | POA: Diagnosis not present

## 2024-02-05 DIAGNOSIS — I340 Nonrheumatic mitral (valve) insufficiency: Secondary | ICD-10-CM | POA: Diagnosis not present

## 2024-02-05 DIAGNOSIS — Z9981 Dependence on supplemental oxygen: Secondary | ICD-10-CM | POA: Diagnosis not present

## 2024-02-05 DIAGNOSIS — Z7951 Long term (current) use of inhaled steroids: Secondary | ICD-10-CM | POA: Diagnosis not present

## 2024-02-05 DIAGNOSIS — I70203 Unspecified atherosclerosis of native arteries of extremities, bilateral legs: Secondary | ICD-10-CM | POA: Diagnosis not present

## 2024-02-05 DIAGNOSIS — F331 Major depressive disorder, recurrent, moderate: Secondary | ICD-10-CM | POA: Diagnosis not present

## 2024-02-05 DIAGNOSIS — Z8701 Personal history of pneumonia (recurrent): Secondary | ICD-10-CM | POA: Diagnosis not present

## 2024-02-05 DIAGNOSIS — J9611 Chronic respiratory failure with hypoxia: Secondary | ICD-10-CM | POA: Diagnosis not present

## 2024-02-05 DIAGNOSIS — J441 Chronic obstructive pulmonary disease with (acute) exacerbation: Secondary | ICD-10-CM | POA: Diagnosis not present

## 2024-03-05 DIAGNOSIS — I509 Heart failure, unspecified: Secondary | ICD-10-CM | POA: Diagnosis not present

## 2024-04-04 DIAGNOSIS — I509 Heart failure, unspecified: Secondary | ICD-10-CM | POA: Diagnosis not present

## 2024-05-05 DIAGNOSIS — I509 Heart failure, unspecified: Secondary | ICD-10-CM | POA: Diagnosis not present

## 2024-05-06 ENCOUNTER — Ambulatory Visit: Admit: 2024-05-06 | Discharge: 2024-05-13 | Payer: Medicare (Managed Care)

## 2024-05-06 ENCOUNTER — Ambulatory Visit: Admit: 2024-05-06 | Payer: Medicare (Managed Care)

## 2024-05-06 ENCOUNTER — Inpatient Hospital Stay
Admit: 2024-05-06 | Discharge: 2024-05-13 | Disposition: A | Discharge status: Home Health | Payer: Medicare (Managed Care) | Admitting: Student in an Organized Health Care Education/Training Program

## 2024-05-06 DIAGNOSIS — K8689 Other specified diseases of pancreas: Secondary | ICD-10-CM | POA: Diagnosis not present

## 2024-05-06 DIAGNOSIS — I2510 Atherosclerotic heart disease of native coronary artery without angina pectoris: Secondary | ICD-10-CM | POA: Diagnosis not present

## 2024-05-06 DIAGNOSIS — Z7951 Long term (current) use of inhaled steroids: Secondary | ICD-10-CM | POA: Diagnosis not present

## 2024-05-06 DIAGNOSIS — J9621 Acute and chronic respiratory failure with hypoxia: Secondary | ICD-10-CM | POA: Diagnosis not present

## 2024-05-06 DIAGNOSIS — E785 Hyperlipidemia, unspecified: Secondary | ICD-10-CM | POA: Diagnosis not present

## 2024-05-06 DIAGNOSIS — J441 Chronic obstructive pulmonary disease with (acute) exacerbation: Secondary | ICD-10-CM | POA: Diagnosis not present

## 2024-05-06 DIAGNOSIS — R404 Transient alteration of awareness: Secondary | ICD-10-CM | POA: Diagnosis not present

## 2024-05-06 DIAGNOSIS — Z794 Long term (current) use of insulin: Secondary | ICD-10-CM | POA: Diagnosis not present

## 2024-05-06 DIAGNOSIS — C649 Malignant neoplasm of unspecified kidney, except renal pelvis: Secondary | ICD-10-CM | POA: Diagnosis not present

## 2024-05-06 DIAGNOSIS — E1165 Type 2 diabetes mellitus with hyperglycemia: Secondary | ICD-10-CM | POA: Diagnosis not present

## 2024-05-06 DIAGNOSIS — A419 Sepsis, unspecified organism: Secondary | ICD-10-CM | POA: Diagnosis not present

## 2024-05-06 DIAGNOSIS — R197 Diarrhea, unspecified: Secondary | ICD-10-CM | POA: Diagnosis not present

## 2024-05-06 DIAGNOSIS — A412 Sepsis due to unspecified staphylococcus: Secondary | ICD-10-CM | POA: Diagnosis not present

## 2024-05-06 DIAGNOSIS — J9620 Acute and chronic respiratory failure, unspecified whether with hypoxia or hypercapnia: Secondary | ICD-10-CM | POA: Diagnosis not present

## 2024-05-06 DIAGNOSIS — I429 Cardiomyopathy, unspecified: Secondary | ICD-10-CM | POA: Diagnosis not present

## 2024-05-06 DIAGNOSIS — Z7984 Long term (current) use of oral hypoglycemic drugs: Secondary | ICD-10-CM | POA: Diagnosis not present

## 2024-05-06 DIAGNOSIS — T380X5A Adverse effect of glucocorticoids and synthetic analogues, initial encounter: Secondary | ICD-10-CM | POA: Diagnosis not present

## 2024-05-06 DIAGNOSIS — J9622 Acute and chronic respiratory failure with hypercapnia: Secondary | ICD-10-CM | POA: Diagnosis not present

## 2024-05-06 DIAGNOSIS — E119 Type 2 diabetes mellitus without complications: Secondary | ICD-10-CM | POA: Diagnosis not present

## 2024-05-06 DIAGNOSIS — F17210 Nicotine dependence, cigarettes, uncomplicated: Secondary | ICD-10-CM | POA: Diagnosis not present

## 2024-05-06 DIAGNOSIS — Z7982 Long term (current) use of aspirin: Secondary | ICD-10-CM | POA: Diagnosis not present

## 2024-05-06 DIAGNOSIS — I110 Hypertensive heart disease with heart failure: Secondary | ICD-10-CM | POA: Diagnosis not present

## 2024-05-06 DIAGNOSIS — R4182 Altered mental status, unspecified: Secondary | ICD-10-CM | POA: Diagnosis not present

## 2024-05-06 DIAGNOSIS — G9340 Encephalopathy, unspecified: Secondary | ICD-10-CM | POA: Diagnosis not present

## 2024-05-06 DIAGNOSIS — R739 Hyperglycemia, unspecified: Secondary | ICD-10-CM | POA: Diagnosis not present

## 2024-05-06 DIAGNOSIS — I5022 Chronic systolic (congestive) heart failure: Secondary | ICD-10-CM | POA: Diagnosis not present

## 2024-05-06 DIAGNOSIS — I10 Essential (primary) hypertension: Secondary | ICD-10-CM | POA: Diagnosis not present

## 2024-05-06 DIAGNOSIS — Z792 Long term (current) use of antibiotics: Secondary | ICD-10-CM | POA: Diagnosis not present

## 2024-05-06 DIAGNOSIS — N2889 Other specified disorders of kidney and ureter: Secondary | ICD-10-CM | POA: Diagnosis not present

## 2024-05-06 DIAGNOSIS — R509 Fever, unspecified: Secondary | ICD-10-CM | POA: Diagnosis not present

## 2024-05-06 DIAGNOSIS — R0602 Shortness of breath: Secondary | ICD-10-CM | POA: Diagnosis not present

## 2024-05-06 DIAGNOSIS — Z89512 Acquired absence of left leg below knee: Secondary | ICD-10-CM | POA: Diagnosis not present

## 2024-05-06 DIAGNOSIS — R7881 Bacteremia: Secondary | ICD-10-CM | POA: Diagnosis not present

## 2024-05-06 DIAGNOSIS — C7900 Secondary malignant neoplasm of unspecified kidney and renal pelvis: Secondary | ICD-10-CM | POA: Diagnosis not present

## 2024-05-06 DIAGNOSIS — R6520 Severe sepsis without septic shock: Secondary | ICD-10-CM | POA: Diagnosis not present

## 2024-05-06 DIAGNOSIS — Z79899 Other long term (current) drug therapy: Secondary | ICD-10-CM | POA: Diagnosis not present

## 2024-05-06 DIAGNOSIS — Z7985 Long-term (current) use of injectable non-insulin antidiabetic drugs: Secondary | ICD-10-CM | POA: Diagnosis not present

## 2024-05-06 DIAGNOSIS — Z1152 Encounter for screening for COVID-19: Secondary | ICD-10-CM | POA: Diagnosis not present

## 2024-05-06 DIAGNOSIS — I3481 Nonrheumatic mitral (valve) annulus calcification: Secondary | ICD-10-CM | POA: Diagnosis not present

## 2024-05-06 DIAGNOSIS — B957 Other staphylococcus as the cause of diseases classified elsewhere: Secondary | ICD-10-CM | POA: Diagnosis not present

## 2024-05-06 DIAGNOSIS — Z720 Tobacco use: Secondary | ICD-10-CM | POA: Diagnosis not present

## 2024-05-06 DIAGNOSIS — G9341 Metabolic encephalopathy: Secondary | ICD-10-CM | POA: Diagnosis not present

## 2024-05-06 DIAGNOSIS — M069 Rheumatoid arthritis, unspecified: Secondary | ICD-10-CM | POA: Diagnosis not present

## 2024-05-06 DIAGNOSIS — J90 Pleural effusion, not elsewhere classified: Secondary | ICD-10-CM | POA: Diagnosis not present

## 2024-05-06 DIAGNOSIS — E875 Hyperkalemia: Secondary | ICD-10-CM | POA: Diagnosis not present

## 2024-05-06 DIAGNOSIS — E871 Hypo-osmolality and hyponatremia: Secondary | ICD-10-CM | POA: Diagnosis not present

## 2024-05-06 DIAGNOSIS — E1151 Type 2 diabetes mellitus with diabetic peripheral angiopathy without gangrene: Secondary | ICD-10-CM | POA: Diagnosis not present

## 2024-05-06 DIAGNOSIS — M0609 Rheumatoid arthritis without rheumatoid factor, multiple sites: Secondary | ICD-10-CM | POA: Diagnosis not present

## 2024-05-06 DIAGNOSIS — A021 Salmonella sepsis: Secondary | ICD-10-CM | POA: Diagnosis not present

## 2024-05-10 DIAGNOSIS — G9341 Metabolic encephalopathy: Secondary | ICD-10-CM | POA: Diagnosis not present

## 2024-05-10 DIAGNOSIS — R6520 Severe sepsis without septic shock: Secondary | ICD-10-CM | POA: Diagnosis not present

## 2024-05-10 DIAGNOSIS — B957 Other staphylococcus as the cause of diseases classified elsewhere: Secondary | ICD-10-CM | POA: Diagnosis not present

## 2024-05-10 DIAGNOSIS — J9621 Acute and chronic respiratory failure with hypoxia: Secondary | ICD-10-CM | POA: Diagnosis not present

## 2024-05-10 DIAGNOSIS — A021 Salmonella sepsis: Secondary | ICD-10-CM | POA: Diagnosis not present

## 2024-05-10 DIAGNOSIS — E1165 Type 2 diabetes mellitus with hyperglycemia: Secondary | ICD-10-CM | POA: Diagnosis not present

## 2024-05-13 MED ORDER — CIPROFLOXACIN 500 MG TABLET
ORAL_TABLET | Freq: Two times a day (BID) | ORAL | 0 refills | 9.00000 days | Status: CP
Start: 2024-05-13 — End: 2024-05-22

## 2024-05-13 NOTE — Progress Notes (Signed)
 Medical Oncology Consult Note    Patient Name: Kaitlyn Dougherty Patient Age: 63 y.o. Physician:  Lauraine JONELLE Louder, ANP  PCP:  Atilano Deward ORN, MD Date of Encounter:  05/14/2024  ASSESSMENT:   Diagnosis ICD-10-CM Associated Orders  1. Left kidney mass  N28.89 MRI Abdomen Pelvis W Contrast    Ambulatory referral to Interventional Radiology    2. Hypocalcemia  E83.51     3. Chronic systolic heart failure      I50.22     4. Hepatic lesion  K76.9 MRI Abdomen Pelvis W Contrast    Ambulatory referral to Interventional Radiology    5. Tobacco use disorder  F17.200     6. Below-knee amputation of left lower extremity, sequela (HHS-HCC)  S88.112S     7. PVD (peripheral vascular disease)  I73.9     8. Cancer      C80.1 MRI Abdomen Pelvis W Contrast    Ambulatory referral to Interventional Radiology    9. Diabetes mellitus type 2, noninsulin dependent      E11.9     10. Other hyperlipidemia  E78.49     11. Long-term current use of opiate analgesic  Z79.891     12. Seropositive rheumatoid arthritis      M05.9     13. Idiopathic peripheral neuropathy  G60.9     14. Periaortic mass  R22.2 Ambulatory referral to Interventional Radiology       PLAN:  Kaitlyn Dougherty is a 63 y.o. 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   MRI of abdomen to further evaluate hepatic lesions and perioral aortic nodal mass Interventional Radiology  to biopsy liver.  Follow-up with us  1 week after biopsy    Orders Placed This Encounter  Procedures  . MRI Abdomen Pelvis W Contrast    Standing Status:   Future    Expected Date:   05/21/2024    Expiration Date:   05/14/2025    What is the patient's sedation requirement?:   No Sedation    Does the patient have metallic implants or external cardiac devices?:   No Implants    Does the patient have any history  of allergic reaction during injection of IV contrast?:   No    Performed at:   External             [facility]    Reason for Exam::   new left kidney mass suspicious for renal cell ca seen on CT scan, multiple liver lesions and periaortic mass  . Ambulatory referral to Interventional Radiology    Standing Status:   Future    Expected Date:   05/14/2024    Expiration Date:   05/14/2025    Referral Priority:   URGENT (2-7 days)    Referral Type:   Generic Referral    Referral Reason:   Specialty Services Required    [facility]    Number of Visits Requested:   1    Lauraine JONELLE Louder, ANP [facility] [place]   -------------------------------------------------------------    History of Present Illness:  Kaitlyn Dougherty is a 63 y.o. female and who is being seen for a new kidney mass suspicious for renal cell carcinoma.  She is accompanied today by her best friend Melba, and her granddaughter Lauraine.  Prior to admission last week her granddaughter Lauraine states she was very uncomfortable in her wheelchair and shaking and unable to catch her breath which led to than taking her to the ER.  She was found to have sepsis, encephalopathy, bacteremia, and acute hypoxic respiratory failure.  The patient states that she has no memory of the incident and just remembers waking up in the ER.   The CT scan was done during a sepsis workup and also showed a new periaortic mass which was presumed nodal/suspicious for metastatic disease, and several hepatic lesions also suspicious for metastasis. with a history of diabetes mellitus (with left below-knee amputation), chronic systolic heart failure, rheumatoid arthritis, hypertension, hyperlipidemia, coronary artery disease, peripheral vascular disease, and COPD on home oxygen , who was admitted on 05/06/2024 for acute altered mental status and shortness of breath.  She was discharged on  05/13/2024  Tobacco use Smokes 6 a day, down from a pack per day - has smoked since 17.  Actively trying to quit now  No ETOH, she denies any use of marijuana, cocaine or street drugs.  She is oxygen  dependent and has been for the last 3 years Mother - had cervical and breast cancer.  No other family history She lives with husband and grand daughter.    Emotional support offered today.  We had a broad discussion of the plan for how to further stage and obtain tissue of her new diagnosis of cancer.  She is not a surgical candidate.  The patient has a lot of questions about prognosis and we did discuss today that there is no cure but that there may be treatment options.  Any discussion of particulars would be premature until we have tissue.  She did ask about how long she would have if she chose not to treat at all and given the information we have now our estimate was generalized as months not years.  We did give her information regarding the overall plan of exploring chemotherapy versus targeted therapy plus or minus external beam radiation.  I did tell her if she chose to not pursue treatment at all and wanted to go home with hospice his choice would also be supported but that she did not need to make a final decision now.  She did state she wants to move ahead with getting tissue and gaining more information before she makes a decision about treatment.  She is tearful.    ASSESSMENT 05/14/2024   Kaitlyn Dougherty Review of Systems:  Review of Systems  Constitutional:  Negative for appetite change, chills, diaphoresis, fever and unexpected weight change.  Gastrointestinal:  Negative for blood in stool, constipation, diarrhea, nausea and vomiting.       Hx of ruptured ulcer  Genitourinary:  Negative for difficulty urinating and hematuria.   Musculoskeletal:  Positive for gait problem (wheelchair bound).  Neurological:  Positive for gait problem (wheelchair bound).  Hematological:   Bruises/bleeds easily.    Oncologic History:  Hematology/Oncology History   No problem history exists.    Past Medical History[1]  Past Surgical History[2]  Family History[3] Mother had cancer but she is uncertain of the type thinks it was cervical and breast  Social History [4]  Social History   Social History Narrative  . Not on file   Allergies: is allergic to adhesive tape-silicones.   Medications:  Medications Ordered Prior to Encounter[5]   Objective:   BP 107/73   Pulse 81   Temp 36.5 C (97.7 F) (Temporal)  Resp 16   SpO2 97%    Physical Exam:  Physical Exam Vitals (Caucasian female with thinning hair, strong tobacco smell) reviewed.  Constitutional:      Appearance: Normal appearance. She is obese.  HENT:     Head: Normocephalic and atraumatic.  Eyes:     Extraocular Movements: Extraocular movements intact.  Cardiovascular:     Rate and Rhythm: Normal rate. Rhythm irregular.     Comments: Multiple skipped beats noted Pulmonary:     Effort: Pulmonary effort is normal.     Comments: Coarse breath sounds noted throughout all lobes bilaterally Abdominal:     Palpations: Abdomen is soft.  Musculoskeletal:     Cervical back: Normal range of motion and neck supple.     Comments: Wheelchair-bound with left above-the-knee amputation  Skin:    General: Skin is warm and dry.     Findings: Bruising (Bruising noted throughout both arms) present.     Comments: PICC line to right arm  Neurological:     General: No focal deficit present.     Mental Status: She is alert and oriented to person, place, and time.  Psychiatric:        Behavior: Behavior normal.     Comments: Tearful, crying off-and-on throughout visit     ECOG: 3 - Capable of only limited self-care; confined to bed or chair more than 50% of waking hours   I personally spent 65 minutes face-to-face and non-face-to-face in the care of this patient, which includes all pre, intra, and post  visit time on the date of service.  All documented time was specific to the E/M visit and does not include any procedures that may have been performed.  At least part, if not all, of this note has been dictated using Advertising account planner voice recognition software and as such may contain typographical errors not appreciated during proofreading.  Lauraine JONELLE Louder, ANP           [1] Past Medical History: Diagnosis Date  . Cancer       . Cardiomyopathy     02/21/2021  . COPD (chronic obstructive pulmonary disease)       . Diabetes mellitus       . Disc disorder of cervical region   . Hyperlipidemia   . Hypertension   . Infection 05/26/2020   left great toe  . Mitral regurgitation   . PUD (peptic ulcer disease)    perforated  . PVD (peripheral vascular disease)   . Rheumatoid arthritis       . Sepsis        perforated ulcer  . Skin cancer   [2] Past Surgical History: Procedure Laterality Date  . BYPASS GRAFT Left 2005   bypass left leg for blockage  . CERVICAL DISC SURGERY  2010  . LYMPH NODE BIOPSY Left    lymph node removed left axilla  . PR AMPUTATION LOW LEG THRU TIB/FIB Left 06/13/2020   Procedure: AMPUTA LEG THRU TIBIA & FIBULA;  Surgeon: Lang Glean Devonshire, MD;  Location: MAIN OR [facility];  Service: Vascular  . PR AMPUTATION TOE,MT-P JT Left 05/26/2020   Procedure: AMPUTATION, TOE; METATARSOPHALANGEAL JOINT LEFT GREAT;  Surgeon: Ellaree Sergio Bolus, MD;  Location: OR [facility];  Service: General Surgery  . PR BYPASS GRAFT OTHR,AXILL-FEM-FEM Right 06/09/2020   Procedure: BYPASS GFT W/OTHER THAN VEIN; AXILRY-FEM-FEM;  Surgeon: Lawayne Patience, MD;  Location: MAIN OR [facility];  Service: Vascular  . PR COLONOSCOPY FLX DX W/COLLJ SPEC WHEN  PFRMD N/A 12/04/2020   Procedure: COLONOSCOPY, FLEXIBLE, PROXIMAL TO SPLENIC FLEXURE; DIAGNOSTIC, W/WO COLLECTION SPECIMEN BY BRUSH OR WASH;  Surgeon: Ellaree Sergio Bolus, MD;  Location: ENDO OR [facility];  Service: General Surgery  . PR INCISION & DRAINAGE  ABSCESS SIMPLE/SINGLE Left 05/29/2020   Procedure: INCISION & DRAINAGE OF ABSCESS; SIMPLE OR SINGLE FOOT;  Surgeon: Ellaree Sergio Bolus, MD;  Location: OR [facility];  Service: General Surgery  . PR INCISION & DRAINAGE COMPLEX PO WOUND INFECTION Left 07/24/2020   Procedure: L GROIN DEBRIDEMENT;  Surgeon: Lang Glean Devonshire, MD;  Location: MAIN OR [facility];  Service: Vascular  . PR REMV ART CLOT LOW LEG,LEG INCIS Left 06/09/2020   Procedure: EMBOLECT/THROMBEC; POP-TIBIO-PER ART BY LEG INCS;  Surgeon: Lawayne Patience, MD;  Location: MAIN OR [facility];  Service: Vascular  . PR REOPERATION, BYPASS GRAFT Left 06/09/2020   Procedure: Reoperation, Femoral-Popliteal Or Femoral(Popliteal)-Anterior Tibial, Posterior Tibial, Peroneal Artery;  Surgeon: Lawayne Patience, MD;  Location: MAIN OR [facility];  Service: Vascular  . PR THROMBOENDARTECTMY FEMORAL COMMON Right 06/09/2020   Procedure: Thromboendarterectomt, With Or Without Patch Graft; Common Femoral;  Surgeon: Lawayne Patience, MD;  Location: MAIN OR [facility];  Service: Vascular  . PR UPPER GI ENDOSCOPY,BIOPSY N/A 12/04/2020   Procedure: UGI ENDOSCOPY; WITH BIOPSY, SINGLE OR MULTIPLE;  Surgeon: Ellaree Sergio Bolus, MD;  Location: ENDO OR [facility];  Service: General Surgery  . STOMACH SURGERY     perforated peptic ulcer  [3] Family History Problem Relation Age of Onset  . Cancer Mother   . Hyperlipidemia Mother   . Hyperlipidemia Father   . Cancer Maternal Grandmother   [4] Social History Socioeconomic History  . Marital status: Married    Spouse name: Tanda  Occupational History  . Occupation: Medical Records [employer]    Employer: [employer]    End: 2022    Comment: Retired  Tobacco Use  . Smoking status: Every Day    Current packs/day: 0.25    Average packs/day: 1 pack/day for 46.0 years (45.6 ttl pk-yrs)    Types: Cigarettes    Start date: 05/14/1978  . Smokeless tobacco: Never  Vaping Use  . Vaping status: Never Used  Substance and Sexual  Activity  . Alcohol  use: Never  . Drug use: Never  . Sexual activity: Not Currently    Partners: Male   Social Drivers of Health   Financial Resource Strain: Low Risk  (05/06/2024)   Overall Financial Resource Strain (CARDIA)   . Difficulty of Paying Living Expenses: Not hard at all  Food Insecurity: No Food Insecurity (05/06/2024)   Hunger Vital Sign   . Worried About Programme researcher, broadcasting/film/video in the Last Year: Never true   . Ran Out of Food in the Last Year: Never true  Transportation Needs: No Transportation Needs (05/06/2024)   PRAPARE - Transportation   . Lack of Transportation (Medical): No   . Lack of Transportation (Non-Medical): No  Physical Activity: Inactive (05/06/2024)   Exercise Vital Sign   . Days of Exercise per Week: 0 days   . Minutes of Exercise per Session: 0 min  Stress: No Stress Concern Present (05/06/2024)   [employer] of Occupational Health - Occupational Stress Questionnaire   . Feeling of Stress: Only a little  Social Connections: Moderately Isolated (05/06/2024)   Social Connection and Isolation Panel   . Frequency of Communication with Friends and Family: More than three times a week   . Frequency of Social Gatherings with Friends and Family: Never   .  Attends Religious Services: Never   . Active Member of Clubs or Organizations: No   . Attends Banker Meetings: Never   . Marital Status: Married  Housing: Low Risk  (05/06/2024)   Housing   . Within the past 12 months, have you ever stayed: outside, in a car, in a tent, in an overnight shelter, or temporarily in someone else's home (i.e. couch-surfing)?: No   . Are you worried about losing your housing?: No  [5] Current Outpatient Medications on File Prior to Visit  Medication Sig Dispense Refill  . albuterol HFA 90 mcg/actuation inhaler albuterol sulfate HFA 90 mcg/actuation aerosol inhaler  INHALE ONE PUFF EVERY 4 HOURS AS NEEDED FOR WHEEZING    . amitriptyline (ELAVIL) 10 MG tablet Take  2 tablets (20 mg total) by mouth nightly.    . amLODIPine  (NORVASC ) 10 MG tablet Take 0.5 tablets (5 mg total) by mouth daily.    . aspirin  (ECOTRIN) 81 MG tablet Take 1 tablet (81 mg total) by mouth daily.    . atorvastatin  (LIPITOR) 20 MG tablet Take 1 tablet (20 mg total) by mouth daily.    . carvediloL (COREG) 6.25 MG tablet Take 1 tablet (6.25 mg total) by mouth Two (2) times a day. 180 tablet 3  . ciprofloxacin HCl (CIPRO) 500 MG tablet Take 1 tablet (500 mg total) by mouth two (2) times a day for 9 days. 18 tablet 0  . dulaglutide (TRULICITY) 0.75 mg/0.5 mL injection pen Inject 0.5 mL (0.75 mg total) under the skin every seven (7) days.    . DULoxetine (CYMBALTA) 60 MG capsule Take 1 capsule (60 mg total) by mouth daily.    . fluticasone-umeclidin-vilanter (TRELEGY ELLIPTA) 100-62.5-25 mcg inhaler Inhale 1 puff  in the morning. 30 each PRN  . gabapentin  (NEURONTIN ) 400 MG capsule Take 1 capsule (400 mg total) by mouth Three (3) times a day.    . ipratropium-albuterol (DUO-NEB) 0.5-2.5 mg/3 mL nebulizer Inhale 3 mL every six (6) hours as needed.    . metFORMIN  (GLUCOPHAGE -XR) 500 MG 24 hr tablet Take 4 tablets (2,000 mg total) by mouth daily.    . oxyCODONE -acetaminophen  (PERCOCET) 10-325 mg per tablet Take 1 tablet by mouth every six (6) hours as needed.    . pantoprazole  (PROTONIX ) 40 MG tablet Take 1 tablet (40 mg total) by mouth daily before breakfast.    . senna-docusate (PERICOLACE) 8.6-50 mg Take 2 tablets by mouth daily.    . vancomycin  in dextrose  5 % (VANCOCIN ) 1.25 gram/250 mL PgBk IVPB (premix) Infuse 250 mL (1,250 mg total) into a venous catheter every eighteen (18) hours for 8 days. 250 mL 9   No current facility-administered medications on file prior to visit.

## 2024-05-14 ENCOUNTER — Ambulatory Visit: Admit: 2024-05-14 | Discharge: 2024-05-15 | Payer: Medicare (Managed Care)

## 2024-05-14 ENCOUNTER — Telehealth: Payer: Self-pay

## 2024-05-14 DIAGNOSIS — N2889 Other specified disorders of kidney and ureter: Principal | ICD-10-CM

## 2024-05-14 DIAGNOSIS — G609 Hereditary and idiopathic neuropathy, unspecified: Principal | ICD-10-CM

## 2024-05-14 DIAGNOSIS — Z79891 Long term (current) use of opiate analgesic: Principal | ICD-10-CM

## 2024-05-14 DIAGNOSIS — S88112S Complete traumatic amputation at level between knee and ankle, left lower leg, sequela: Principal | ICD-10-CM

## 2024-05-14 DIAGNOSIS — M059 Rheumatoid arthritis with rheumatoid factor, unspecified: Principal | ICD-10-CM

## 2024-05-14 DIAGNOSIS — R222 Localized swelling, mass and lump, trunk: Principal | ICD-10-CM

## 2024-05-14 DIAGNOSIS — I739 Peripheral vascular disease, unspecified: Principal | ICD-10-CM

## 2024-05-14 DIAGNOSIS — I5022 Chronic systolic (congestive) heart failure: Principal | ICD-10-CM

## 2024-05-14 DIAGNOSIS — C801 Malignant (primary) neoplasm, unspecified: Principal | ICD-10-CM

## 2024-05-14 DIAGNOSIS — F17200 Nicotine dependence, unspecified, uncomplicated: Principal | ICD-10-CM

## 2024-05-14 DIAGNOSIS — K769 Liver disease, unspecified: Principal | ICD-10-CM

## 2024-05-14 DIAGNOSIS — E7849 Other hyperlipidemia: Principal | ICD-10-CM

## 2024-05-14 DIAGNOSIS — E119 Type 2 diabetes mellitus without complications: Principal | ICD-10-CM

## 2024-05-14 DIAGNOSIS — R7881 Bacteremia: Secondary | ICD-10-CM | POA: Diagnosis not present

## 2024-05-14 DIAGNOSIS — E1169 Type 2 diabetes mellitus with other specified complication: Secondary | ICD-10-CM | POA: Diagnosis not present

## 2024-05-14 MED ORDER — VANCOMYCIN 1.25 GRAM/250 ML IN DEXTROSE 5 % INTRAVENOUS PIGGYBACK
INTRAVENOUS | 9 refills | 1.00000 days | Status: CP
Start: 2024-05-14 — End: 2024-05-22

## 2024-05-14 NOTE — Progress Notes (Signed)
 Patient in  clinic today for consultation of renal mass, accompanied by granddaughter and friend. Patient is a LBKA - height and weight not obtained today. No other concerns noted. Provider aware.   Will schedule any tests or procedures and arrange any referrals as indicated by provider. MyChart active.

## 2024-05-14 NOTE — Transitions of Care (Post Inpatient/ED Visit) (Signed)
   05/14/2024  Name: Kaitlyn Dougherty MRN: [id number] DOB: 02-Jan-1961  Today's TOC FU Call Status: Today's TOC FU Call Status:: Unsuccessful Call (1st Attempt) Unsuccessful Call (1st Attempt) Date: 05/14/24  Attempted to reach the patient regarding the most recent Inpatient/ED visit. TOC RN CM left confidential message on designated phone number with call back number for RN CM identified on message.   Follow Up Plan: Additional outreach attempts will be made to reach the patient to complete the Transitions of Care (Post Inpatient/ED visit) call.    Bing Edison MSN, RN RN Case Sales executive Health  [facility]BC[facility]Population Health Office Hours M-F [phone] Direct Dial: [phone] Main Phone [phone]  Fax: [phone] .com

## 2024-05-14 NOTE — Addendum Note (Signed)
 Addended by: INMAN, KAYLA L on: 05/14/2024 02:31 PM  Modules accepted: Orders

## 2024-05-14 NOTE — Care Plan (Signed)
 Transition of Care Encounter Data   Call attempt: 1 Admission date: 05/06/24 Discharge date: 05/13/24 Discharge diagnosis: COPD Do you have a hospital follow up appointment?: Yes - Within 14 Days, Yes with Primary PCP clinic F/U Date: 05/14/24 F/U Provider: Vicci Lauraine Stabs, ANP Patient post discharge: Are you able to make it to your f/u appt.?: Yes Since your discharge, are your symptoms better, worse, or the same?: Better Have you developed any new symptoms?: No Is there someone to help you at home?: Yes  Are there questions we can help clarify before your next appointment?: No Medications:      Were you able to pick up all of your newly prescribed medications (and/or any necessary refills)?: Yes  Were medications prescribed or ordered upon discharge reviewed today with the most recent outpatient medication list?: Yes Patient was reminded to bring all of their medication bottles to their follow up appointment: Yes If Home Health Services or Medical Equipment was ordered, has it arrived?: Yes (Comment: HH.  Patient stated HH was not coming due to people being out.) Was the patients f/u appt. date/time and location confirmed with the patient?: Yes Remind Patients: If patient has a non-emergent medical problem, they may contact a nurse 24/7 or patient may call their provider's clinic. If experiencing a medical emergency, patient should call 911: Yes .   [facility]: [phone]:  .  [facility]: [phone]:  .  Other: Contact PCP:      Note routed to patient's PCP  Low priority, no response needed unless change in care.   I am reaching out to Mliss Almarie Ned as part of the transitional case management team regarding her recent hospital discharge.  See chart for med list if needed.  Sharing communication as part of regulatory requirements.  Koren Shines, BSN, RN

## 2024-05-18 ENCOUNTER — Encounter: Payer: Self-pay | Admitting: Nurse Practitioner

## 2024-05-18 ENCOUNTER — Telehealth: Payer: Self-pay

## 2024-05-18 DIAGNOSIS — A419 Sepsis, unspecified organism: Secondary | ICD-10-CM | POA: Diagnosis not present

## 2024-05-18 DIAGNOSIS — J9601 Acute respiratory failure with hypoxia: Secondary | ICD-10-CM | POA: Diagnosis not present

## 2024-05-18 NOTE — Transitions of Care (Post Inpatient/ED Visit) (Signed)
   05/18/2024  Name: Kaitlyn Dougherty MRN: [id number] DOB: 1961/07/25  Today's TOC FU Call Status: Today's TOC FU Call Status:: Unsuccessful Call (2nd Attempt) Unsuccessful Call (2nd Attempt) Date: 05/18/24  Attempted to reach the patient regarding the most recent Inpatient/ED visit. Call went straight to VM then disconnected or call dropped.   Follow Up Plan: Additional outreach attempts will be made to reach the patient to complete the Transitions of Care (Post Inpatient/ED visit) call.    Bing Edison MSN, RN RN Case Sales executive Health  [facility]BC[facility]-Population Health Office Hours M-F [phone] Direct Dial: [phone] Main Phone [phone]  Fax: [phone] [email or web address]

## 2024-05-19 ENCOUNTER — Telehealth: Payer: Self-pay

## 2024-05-19 ENCOUNTER — Other Ambulatory Visit (HOSPITAL_COMMUNITY): Payer: Self-pay | Admitting: Nurse Practitioner

## 2024-05-19 DIAGNOSIS — K769 Liver disease, unspecified: Secondary | ICD-10-CM

## 2024-05-19 DIAGNOSIS — R222 Localized swelling, mass and lump, trunk: Secondary | ICD-10-CM

## 2024-05-19 DIAGNOSIS — N2889 Other specified disorders of kidney and ureter: Secondary | ICD-10-CM

## 2024-05-19 NOTE — Transitions of Care (Post Inpatient/ED Visit) (Signed)
   05/19/2024  Name: ALPHONSINE MINIUM MRN: [id number] DOB: 01-06-1961  Today's TOC FU Call Status: Today's TOC FU Call Status:: Successful TOC FU Call Completed TOC FU Call Complete Date: 05/19/24 [facility] RN spoke with patient who states she has a home health nurse already coming and answers all of her questions and declined TOC program) Patient's Name and Date of Birth confirmed.  Shona Prow RN, CCM [place]  [facility]BCI-Population Health RN Care Manager [phone]

## 2024-05-20 ENCOUNTER — Encounter: Admit: 2024-05-20 | Discharge: 2024-05-20 | Payer: Medicare (Managed Care)

## 2024-05-20 DIAGNOSIS — R7881 Bacteremia: Principal | ICD-10-CM

## 2024-05-20 DIAGNOSIS — A419 Sepsis, unspecified organism: Principal | ICD-10-CM

## 2024-05-20 DIAGNOSIS — J9601 Acute respiratory failure with hypoxia: Principal | ICD-10-CM

## 2024-05-21 DIAGNOSIS — F17210 Nicotine dependence, cigarettes, uncomplicated: Secondary | ICD-10-CM | POA: Diagnosis not present

## 2024-05-25 DIAGNOSIS — E1151 Type 2 diabetes mellitus with diabetic peripheral angiopathy without gangrene: Secondary | ICD-10-CM | POA: Diagnosis not present

## 2024-05-25 DIAGNOSIS — R7881 Bacteremia: Secondary | ICD-10-CM | POA: Diagnosis not present

## 2024-05-25 DIAGNOSIS — I25118 Atherosclerotic heart disease of native coronary artery with other forms of angina pectoris: Secondary | ICD-10-CM | POA: Diagnosis not present

## 2024-05-25 DIAGNOSIS — E1165 Type 2 diabetes mellitus with hyperglycemia: Secondary | ICD-10-CM | POA: Diagnosis not present

## 2024-05-25 DIAGNOSIS — J441 Chronic obstructive pulmonary disease with (acute) exacerbation: Secondary | ICD-10-CM | POA: Diagnosis not present

## 2024-05-25 DIAGNOSIS — J9621 Acute and chronic respiratory failure with hypoxia: Secondary | ICD-10-CM | POA: Diagnosis not present

## 2024-05-25 DIAGNOSIS — M059 Rheumatoid arthritis with rheumatoid factor, unspecified: Secondary | ICD-10-CM | POA: Diagnosis not present

## 2024-05-25 DIAGNOSIS — I110 Hypertensive heart disease with heart failure: Secondary | ICD-10-CM | POA: Diagnosis not present

## 2024-05-25 DIAGNOSIS — G9341 Metabolic encephalopathy: Secondary | ICD-10-CM | POA: Diagnosis not present

## 2024-05-25 DIAGNOSIS — I5023 Acute on chronic systolic (congestive) heart failure: Secondary | ICD-10-CM | POA: Diagnosis not present

## 2024-05-25 DIAGNOSIS — A419 Sepsis, unspecified organism: Secondary | ICD-10-CM | POA: Diagnosis not present

## 2024-05-25 DIAGNOSIS — B957 Other staphylococcus as the cause of diseases classified elsewhere: Secondary | ICD-10-CM | POA: Diagnosis not present

## 2024-05-27 ENCOUNTER — Other Ambulatory Visit (HOSPITAL_COMMUNITY): Payer: Self-pay | Admitting: Nurse Practitioner

## 2024-05-27 DIAGNOSIS — N2889 Other specified disorders of kidney and ureter: Secondary | ICD-10-CM

## 2024-05-29 ENCOUNTER — Emergency Department
Admit: 2024-05-29 | Discharge: 2024-05-29 | Disposition: A | Discharge status: Home / Self Care | Payer: Medicare (Managed Care)

## 2024-05-29 DIAGNOSIS — J441 Chronic obstructive pulmonary disease with (acute) exacerbation: Principal | ICD-10-CM

## 2024-05-29 DIAGNOSIS — E785 Hyperlipidemia, unspecified: Secondary | ICD-10-CM | POA: Diagnosis not present

## 2024-05-29 DIAGNOSIS — R262 Difficulty in walking, not elsewhere classified: Secondary | ICD-10-CM | POA: Diagnosis not present

## 2024-05-29 DIAGNOSIS — R059 Cough, unspecified: Secondary | ICD-10-CM | POA: Diagnosis not present

## 2024-05-29 DIAGNOSIS — Z743 Need for continuous supervision: Secondary | ICD-10-CM | POA: Diagnosis not present

## 2024-05-29 DIAGNOSIS — Z20822 Contact with and (suspected) exposure to covid-19: Secondary | ICD-10-CM | POA: Diagnosis not present

## 2024-05-29 DIAGNOSIS — I429 Cardiomyopathy, unspecified: Secondary | ICD-10-CM | POA: Diagnosis not present

## 2024-05-29 DIAGNOSIS — R0602 Shortness of breath: Secondary | ICD-10-CM | POA: Diagnosis not present

## 2024-05-29 DIAGNOSIS — R609 Edema, unspecified: Secondary | ICD-10-CM | POA: Diagnosis not present

## 2024-05-29 DIAGNOSIS — E119 Type 2 diabetes mellitus without complications: Secondary | ICD-10-CM | POA: Diagnosis not present

## 2024-05-29 DIAGNOSIS — F17210 Nicotine dependence, cigarettes, uncomplicated: Secondary | ICD-10-CM | POA: Diagnosis not present

## 2024-05-29 DIAGNOSIS — R0981 Nasal congestion: Secondary | ICD-10-CM | POA: Diagnosis not present

## 2024-05-29 DIAGNOSIS — I10 Essential (primary) hypertension: Secondary | ICD-10-CM | POA: Diagnosis not present

## 2024-05-29 DIAGNOSIS — R0600 Dyspnea, unspecified: Secondary | ICD-10-CM | POA: Diagnosis not present

## 2024-05-29 MED ORDER — PREDNISONE 20 MG TABLET
ORAL_TABLET | Freq: Every day | ORAL | 0 refills | 5.00000 days | Status: CP
Start: 2024-05-29 — End: 2024-06-03

## 2024-06-01 DIAGNOSIS — C801 Malignant (primary) neoplasm, unspecified: Principal | ICD-10-CM

## 2024-06-01 MED ORDER — ALPRAZOLAM 0.5 MG TABLET
ORAL_TABLET | ORAL | 0 refills | 3.00000 days | Status: CP | PRN
Start: 2024-06-01 — End: 2024-06-04

## 2024-06-02 NOTE — Progress Notes (Unsigned)
 [name], MD  Daralene Ferol FALCON, RT PROCEDURE / BIOPSY REVIEW Date: 06/02/24  Requested Biopsy site: Periaortic mass, left side Reason for request: mass Imaging review: Best seen on CT 05/07/24  Decision: Approved Imaging modality to perform: CT Schedule with: Moderate Sedation Schedule for: Any VIR  Additional comments: @Schedulers . The request was for renal and liver mass.  The lesion to be scheduled for biopsy is the periaortic mass on the left side as that is more appropriate.  Please see if We can get more of the study uploaded as not all of the images from the study are available.     Please contact me with questions, concerns, or if issue pertaining to this request arise.  Cordella LABOR Jenna, MD [facility] Radiology       Previous Messages    ----- Message ----- From: Daralene Ferol FALCON, RT Sent: 06/01/2024   5:41 PM EDT To: Cordella LABOR Jenna, MD Subject: RE: US  BIOPSY (LIVER) & US  BIOPSY (KIDNEY)    I contacted the patient, her name is Kaitlyn Dougherty. The pt uses [name] in the [facility] system and thought it was [name] with us  as well. Her license is scanned in as well as her insurance card. Her ins card states [name], meanwhiler her license has Kaitlyn Bonds, using her maiden name as the middle name. I did confirm the address on this chart and the paper work for this pt is the same as well as the phone numbers. ----- Message ----- From: [name], MD Sent: 06/01/2024   5:03 PM EDT To: Ferol FALCON Daralene, RT Subject: RE: US  BIOPSY (LIVER) & US  BIOPSY (KIDNEY)    I think there is a mixup between patients.  I think the accession number you give is for a [name] who is 63 dob 1961-06-30.  This patient [name] does not have finding matching below.  Once this is corrected we can proceed. ----- Message ----- From: Daralene Ferol FALCON, RT Sent: 05/31/2024   2:59 PM EDT To:  Channing LITTIE Eagles; Taryn F Rigney, RT; Ir Proc* Subject: US  BIOPSY (LIVER) & US  BIOPSY (KIDNEY)        Procedure : US  BIOPSY (LIVER) & US  BIOPSY (KIDNEY)  Reason : new left kidney mass,mult hepatic lesions and periaortic mass Dx: Left kidney mass [N28.89 (ICD-10-CM)]; Hepatic lesion [K76.9 (ICD-10-CM)]; Periaortic mass [R22.2 (ICD-10-CM)]   History : CT Chest Abdomen Pelvis W Contrast (Accession [id number]) LOCATED IN PACS - Outside imaging  Provider: Lauraine JONELLE Louder  Provider contact ; [phone]  I did call and confirm with physician that they want both bx done. They would like to know if both can be performed at same time? If so would more time need to be allotted for bx?

## 2024-06-04 NOTE — Progress Notes (Signed)
 Luverne Aran, MD  Daralene Ferol FALCON, RT PROCEDURE / BIOPSY REVIEW Date: 06/04/24  Requested Biopsy site: Left renal mass, possible liver lesion. Reason for request: Left renal mass. Possible liver lesions. Imaging review: Best seen on outside CT CAP from [facility]  Decision: Approved Imaging modality to perform: Ultrasound Schedule with: Moderate Sedation Schedule for: Any VIR  Additional comments: @VIR : Scan liver supine first to check liver for any visible lesions. Unlikely that we will see anything that can be sampled in the liver based on the CT. Turn prone for left posterior renal mass biopsy. @Schedulers . See above.  Please contact me with questions, concerns, or if issue pertaining to this request arise.  Aran ONEIDA Luverne, MD [facility] Radiology       Previous Messages    ----- Message ----- From: Daralene Ferol FALCON, RT Sent: 06/04/2024   1:51 PM EDT To: Aran Luverne, MD Subject: RE: US  Liver & US  Kidney Biopsies              I s/w [facility] and they removed the imaging from PACS and reuploaded it. Per [facility] there should now be 1,113 images. I counted them and got that total, Can you please check them?   ----- Message ----- From: Luverne Aran, MD Sent: 06/04/2024  10:26 AM EDT To: Channing LITTIE Eagles; Dystany Duffy F Devery Murgia, RT; Ir Proc* Subject: RE: US  Liver & US  Kidney Biopsies              The entire CT abd/pelvis is stil not in PACS to review and stops at the level of the upper left kidney on the axials. We need the ENTIRE exam, including coronal recons and any delayed imaging if they got it.  GY   ----- Message ----- From: Daralene Ferol FALCON, RT Sent: 06/03/2024   2:51 PM EDT To: Channing LITTIE Eagles; Moss Berry F Ora Mcnatt, RT; Ir Proc* Subject: US  Liver & US  Kidney Biopsies                  I called [facility] to have images resent so you can review. Per [facility], there is now 471 images available for you to review. Please let me know if all the  images change anything reguarding the biopsy.  Ferol Daralene Banner, Cordella LABOR, MD  Daralene Ferol FALCON, RT PROCEDURE / BIOPSY REVIEW Date: 06/02/24  Requested Biopsy site: Periaortic mass, left side Reason for request: mass Imaging review: Best seen on CT 05/07/24  Decision: Approved Imaging modality to perform: CT Schedule with: Moderate Sedation Schedule for: Any VIR  Additional comments: @Schedulers . The request was for renal and liver mass.  The lesion to be scheduled for biopsy is the periaortic mass on the left side as that is more appropriate.  Please see if We can get more of the study uploaded as not all of the images from the study are available.   Please contact me with questions, concerns, or if issue pertaining to this request arise.  Cordella LABOR Banner, MD [facility] Radiology     Previous Messages    ----- Message ----- From: Daralene Ferol FALCON, RT Sent: 06/01/2024   5:41 PM EDT To: Cordella LABOR Banner, MD Subject: RE: US  BIOPSY (LIVER) & US  BIOPSY (KIDNEY)    I contacted the patient, her name is Akosua Constantine. The pt uses Aftyn Nott in the [facility] system and thought it was Kaitelyn E with us  as well.  Her license is scanned in as well as her insurance card. Her ins card states Ethleen Lormand, meanwhiler her license has Mliss Bonds, using her maiden name as the middle name. I did confirm the address on this chart and the paper work for this pt is the same as well as the phone numbers.   ----- Message ----- From: [name], MD Sent: 06/01/2024   5:03 PM EDT To: Ferol JULIANNA Conger, RT Subject: RE: US  BIOPSY (LIVER) & US  BIOPSY (KIDNEY)    I think there is a mixup between patients.  I think the accession number you give is for a Alyria dakia schifano who is 63 dob 12-15-1960.  This patient Gianah B Moorman does not have finding matching below.  Once this is corrected we can proceed.   ----- Message  ----- From: Conger Ferol JULIANNA, RT Sent: 05/31/2024   2:59 PM EDT To: Channing LITTIE Eagles; Danyele Smejkal F Chaden Doom, RT; Ir Proc* Subject: US  BIOPSY (LIVER) & US  BIOPSY (KIDNEY)        Procedure : US  BIOPSY (LIVER) & US  BIOPSY (KIDNEY)  Reason : new left kidney mass,mult hepatic lesions and periaortic mass Dx: Left kidney mass [N28.89 (ICD-10-CM)]; Hepatic lesion [K76.9 (ICD-10-CM)]; Periaortic mass [R22.2 (ICD-10-CM)]   History : CT Chest Abdomen Pelvis W Contrast (Accession [id number]) LOCATED IN PACS - Outside imaging  Provider: Lauraine JONELLE Louder  Provider contact ; [phone]  I did call and confirm with physician that they want both bx done. They would like to know if both can be performed at same time? If so would more time need to be allotted for bx?

## 2024-06-05 DIAGNOSIS — I509 Heart failure, unspecified: Secondary | ICD-10-CM | POA: Diagnosis not present

## 2024-06-07 ENCOUNTER — Encounter: Payer: Self-pay | Admitting: Radiology

## 2024-06-07 NOTE — Progress Notes (Signed)
 Kaitlyn Dougherty, Kaitlyn Dougherty  Kaitlyn Dougherty, RT No, it's the renal mass. 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 You can put her into a double US  slot: 1 hour       Previous Messages    ----- Message ----- From: Kaitlyn Dougherty, RT Sent: 06/04/2024   3:12 PM EDT To: Dougherty Kaitlyn Dougherty, Kaitlyn Dougherty Subject: RE: US  Liver & US  Kidney Biopsies              Can these be performed at the same time? Do we need extra time for the two biopsies?  Also, Dr Jenna noted a Periaortic mass on the left side in an earlier message before all imaging was received. This is his notes on it: The request was for renal and liver mass.  The lesion to be scheduled for biopsy is the periaortic mass on the left side as that is more appropriate.  Please see if We can get more of the study uploaded as not all of the images from the study are available.  Please advise Quan Kaitlyn ----- Message ----- From: Kaitlyn Dougherty, Kaitlyn Dougherty Sent: 06/04/2024   1:58 PM EDT To: Quan Dougherty Kaitlyn, RT Subject: RE: US  Liver & US  Kidney Biopsies              PROCEDURE / BIOPSY REVIEW Date: 06/04/24  Requested Biopsy site: Left renal mass, possible liver lesion. Reason for request: Left renal mass. Possible liver lesions. Imaging review: Best seen on outside CT CAP from [facility]  Decision: Approved Imaging modality to perform: Ultrasound Schedule with: Moderate Sedation Schedule for: Any VIR  Additional comments: @VIR : Scan liver supine first to check liver for any visible lesions. Unlikely that we will see anything that can be sampled in the liver based on the CT. Turn prone for left posterior renal mass biopsy. @Schedulers . See above.  Please contact me with questions, concerns, or if issue pertaining to this request arise.  Dougherty ONEIDA Kaitlyn Dougherty, Kaitlyn Dougherty [facility] Radiology ----- Message ----- From: Kaitlyn Dougherty, RT Sent: 06/04/2024   1:51 PM EDT To: Dougherty Kaitlyn Dougherty, Kaitlyn Dougherty Subject: RE: US  Liver & US   Kidney Biopsies              I s/w [facility] and they removed the imaging from PACS and reuploaded it. Per [facility] there should now be 1,113 images. I counted them and got that total, Can you please check them? ----- Message ----- From: Kaitlyn Dougherty, Kaitlyn Dougherty Sent: 06/04/2024  10:26 AM EDT To: Channing LITTIE Eagles; Xai Frerking Dougherty Tayt Moyers, RT; Ir Proc* Subject: RE: US  Liver & US  Kidney Biopsies              The entire CT abd/pelvis is stil not in PACS to review and stops at the level of the upper left kidney on the axials. We need the ENTIRE exam, including coronal recons and any delayed imaging if they got it.  GY ----- Message ----- From: Kaitlyn Dougherty, RT Sent: 06/03/2024   2:51 PM EDT To: Channing LITTIE Eagles; Luqman Perrelli Dougherty Demetrice Combes, RT; Ir Proc* Subject: US  Liver & US  Kidney Biopsies                  I called [facility] to have images resent so you can review. Per [facility], there is now 471 images available for you to review. Please let me know if all the images change anything reguarding the biopsy.  Quan Kaitlyn Jenna Cordella Dougherty, Kaitlyn Dougherty  Kaitlyn Ferol Dougherty, RT PROCEDURE / BIOPSY REVIEW Date: 06/02/24  Requested Biopsy site: Periaortic mass, left side Reason for request: mass Imaging review: Best seen on CT 05/07/24  Decision: Approved Imaging modality to perform: CT Schedule with: Moderate Sedation Schedule for: Any VIR  Additional comments: @Schedulers . The request was for renal and liver mass.  The lesion to be scheduled for biopsy is the periaortic mass on the left side as that is more appropriate.  Please see if We can get more of the study uploaded as not all of the images from the study are available.     Please contact me with questions, concerns, or if issue pertaining to this request arise.  Cordella Dougherty Banner, Kaitlyn Dougherty [facility] Radiology     Previous Messages    ----- Message ----- From: Kaitlyn Ferol Dougherty, RT Sent: 06/01/2024   5:41 PM EDT To: Cordella Dougherty Banner, Kaitlyn Dougherty Subject: RE: US  BIOPSY (LIVER) & US  BIOPSY (KIDNEY)    I contacted the patient, her name is Kymberley Raz. The pt uses Sole Lengacher in the [facility] system and thought it was Tina E with us  as well. Her license is scanned in as well as her insurance card. Her ins card states Kaitlyn Dougherty, meanwhiler her license has Kaitlyn Dougherty, using her maiden name as the middle name. I did confirm the address on this chart and the paper work for this pt is the same as well as the phone numbers. ----- Message ----- From: Banner Cordella Dougherty, Kaitlyn Dougherty Sent: 06/01/2024   5:03 PM EDT To: Ferol Dougherty Kaitlyn, RT Subject: RE: US  BIOPSY (LIVER) & US  BIOPSY (KIDNEY)    I think there is a mixup between patients.  I think the accession number you give is for a Amery feiga nadel who is 63 dob 1960-10-15.  This patient Kaitlyn Dougherty does not have finding matching below.  Once this is corrected we can proceed. ----- Message ----- From: Kaitlyn Ferol Dougherty, RT Sent: 05/31/2024   2:59 PM EDT To: Channing LITTIE Eagles; Ac Colan Dougherty Asher Torpey, RT; Ir Proc* Subject: US  BIOPSY (LIVER) & US  BIOPSY (KIDNEY)        Procedure : US  BIOPSY (LIVER) & US  BIOPSY (KIDNEY)  Reason : new left kidney mass,mult hepatic lesions and periaortic mass Dx: Left kidney mass [N28.89 (ICD-10-CM)]; Hepatic lesion [K76.9 (ICD-10-CM)]; Periaortic mass [R22.2 (ICD-10-CM)]   History : CT Chest Abdomen Pelvis W Contrast (Accession [id number]) LOCATED IN PACS - Outside imaging  Provider: Lauraine JONELLE Louder  Provider contact ; [phone]  I did call and confirm with physician that they want both bx done. They would like to know if both can be performed at same time? If so would more time need to be allotted for bx?

## 2024-06-22 NOTE — Telephone Encounter (Signed)
 Spoke with Syrina at [facility]. Patient informed their staff she has some sort of implant in her breast. She is going to contact [facility] to see what the implant is and call them back so she can schedule.   Upon assessing patient chart she did have a SNL for melanoma in 02/2020 at [facility]. There  is no mention of something being placed in her breast.   [facility] is going to contact patient for clarification and to schedule the MRI.

## 2024-06-28 ENCOUNTER — Ambulatory Visit (HOSPITAL_COMMUNITY): Admission: RE | Admit: 2024-06-28 | Source: Ambulatory Visit

## 2024-06-28 ENCOUNTER — Ambulatory Visit (HOSPITAL_COMMUNITY)
Admission: RE | Admit: 2024-06-28 | Discharge: 2024-06-28 | Disposition: A | Discharge status: Home / Self Care | Source: Ambulatory Visit | Attending: Family Medicine | Admitting: Family Medicine

## 2024-06-28 ENCOUNTER — Telehealth: Payer: Self-pay

## 2024-06-28 ENCOUNTER — Encounter (HOSPITAL_COMMUNITY): Payer: Self-pay

## 2024-06-28 DIAGNOSIS — N2889 Other specified disorders of kidney and ureter: Secondary | ICD-10-CM

## 2024-06-28 NOTE — Telephone Encounter (Signed)
 Patient did not arrive for scheduled liver (or renal if no liver target) bx this AM. Reached out to schedulers to reschedule her.

## 2024-07-05 DIAGNOSIS — I509 Heart failure, unspecified: Secondary | ICD-10-CM | POA: Diagnosis not present

## 2024-07-18 ENCOUNTER — Emergency Department
Admit: 2024-07-18 | Discharge: 2024-07-18 | Disposition: A | Discharge status: Home / Self Care | Payer: Medicare (Managed Care)

## 2024-07-18 DIAGNOSIS — R0602 Shortness of breath: Principal | ICD-10-CM

## 2024-07-18 DIAGNOSIS — J209 Acute bronchitis, unspecified: Principal | ICD-10-CM

## 2024-07-18 DIAGNOSIS — T7840XA Allergy, unspecified, initial encounter: Principal | ICD-10-CM

## 2024-07-18 DIAGNOSIS — J189 Pneumonia, unspecified organism: Principal | ICD-10-CM

## 2024-07-18 DIAGNOSIS — I10 Essential (primary) hypertension: Principal | ICD-10-CM

## 2024-07-18 DIAGNOSIS — I429 Cardiomyopathy, unspecified: Secondary | ICD-10-CM | POA: Diagnosis not present

## 2024-07-18 DIAGNOSIS — E785 Hyperlipidemia, unspecified: Secondary | ICD-10-CM | POA: Diagnosis not present

## 2024-07-18 DIAGNOSIS — J440 Chronic obstructive pulmonary disease with acute lower respiratory infection: Secondary | ICD-10-CM | POA: Diagnosis not present

## 2024-07-18 DIAGNOSIS — Z7401 Bed confinement status: Secondary | ICD-10-CM | POA: Diagnosis not present

## 2024-07-18 DIAGNOSIS — I2510 Atherosclerotic heart disease of native coronary artery without angina pectoris: Secondary | ICD-10-CM | POA: Diagnosis not present

## 2024-07-18 DIAGNOSIS — F17210 Nicotine dependence, cigarettes, uncomplicated: Secondary | ICD-10-CM | POA: Diagnosis not present

## 2024-07-18 DIAGNOSIS — R069 Unspecified abnormalities of breathing: Secondary | ICD-10-CM | POA: Diagnosis not present

## 2024-07-18 DIAGNOSIS — E1151 Type 2 diabetes mellitus with diabetic peripheral angiopathy without gangrene: Secondary | ICD-10-CM | POA: Diagnosis not present

## 2024-07-18 DIAGNOSIS — I700 Atherosclerosis of aorta: Secondary | ICD-10-CM | POA: Diagnosis not present

## 2024-07-18 DIAGNOSIS — X58XXXA Exposure to other specified factors, initial encounter: Secondary | ICD-10-CM | POA: Diagnosis not present

## 2024-07-18 DIAGNOSIS — R062 Wheezing: Secondary | ICD-10-CM | POA: Diagnosis not present

## 2024-07-18 DIAGNOSIS — T782XXA Anaphylactic shock, unspecified, initial encounter: Secondary | ICD-10-CM | POA: Diagnosis not present

## 2024-07-18 MED ORDER — PREDNISONE 20 MG TABLET
ORAL_TABLET | Freq: Every day | ORAL | 0 refills | 3.00000 days | Status: CP
Start: 2024-07-18 — End: 2024-07-21

## 2024-07-18 MED ORDER — AZITHROMYCIN 250 MG TABLET
ORAL_TABLET | Freq: Every day | ORAL | 0 refills | 6.00000 days | Status: CP
Start: 2024-07-18 — End: ?

## 2024-07-18 MED ORDER — ALBUTEROL SULFATE HFA 90 MCG/ACTUATION AEROSOL INHALER
RESPIRATORY_TRACT | 0 refills | 0.00000 days | Status: CP | PRN
Start: 2024-07-18 — End: 2025-07-18

## 2024-08-20 NOTE — Progress Notes (Signed)
 SALLYANNE BIRKHEAD                                          MRN: [id number]   08/20/2024   The [facility] Quality Team Specialist reviewed this patient medical record for the purposes of chart review for care gap closure. The following were reviewed: chart review for care gap closure-kidney health evaluation for diabetes:eGFR  and uACR.    VBCI Quality Team

## 2024-10-17 DEATH — deceased

## 2024-10-21 NOTE — Progress Notes (Signed)
 Kaitlyn Dougherty                                          MRN: [id number]   10/21/2024   The [facility]BCI Quality Team Specialist reviewed this patient medical record for the purposes of chart review for care gap closure. The following were reviewed: chart review for care gap closure-diabetic eye exam.    VBCI Quality Team
